# Patient Record
Sex: Female | Born: 1965 | Race: White | Hispanic: No | Marital: Married | Smoking: Never smoker
Health system: Southern US, Community
[De-identification: ages and names within clinical notes are randomized; demographics above are authoritative.]

## PROBLEM LIST (undated history)

## (undated) DIAGNOSIS — N83209 Unspecified ovarian cyst, unspecified side: Secondary | ICD-10-CM

## (undated) DIAGNOSIS — Z808 Family history of malignant neoplasm of other organs or systems: Secondary | ICD-10-CM

## (undated) DIAGNOSIS — Z801 Family history of malignant neoplasm of trachea, bronchus and lung: Secondary | ICD-10-CM

## (undated) DIAGNOSIS — D649 Anemia, unspecified: Secondary | ICD-10-CM

## (undated) DIAGNOSIS — Z800 Family history of malignant neoplasm of digestive organs: Secondary | ICD-10-CM

## (undated) DIAGNOSIS — Z8619 Personal history of other infectious and parasitic diseases: Secondary | ICD-10-CM

## (undated) DIAGNOSIS — D219 Benign neoplasm of connective and other soft tissue, unspecified: Secondary | ICD-10-CM

## (undated) DIAGNOSIS — Z8049 Family history of malignant neoplasm of other genital organs: Secondary | ICD-10-CM

## (undated) DIAGNOSIS — B009 Herpesviral infection, unspecified: Secondary | ICD-10-CM

## (undated) HISTORY — DX: Benign neoplasm of connective and other soft tissue, unspecified: D21.9

## (undated) HISTORY — DX: Family history of malignant neoplasm of digestive organs: Z80.0

## (undated) HISTORY — DX: Anemia, unspecified: D64.9

## (undated) HISTORY — DX: Personal history of other infectious and parasitic diseases: Z86.19

## (undated) HISTORY — DX: Unspecified ovarian cyst, unspecified side: N83.209

## (undated) HISTORY — DX: Family history of malignant neoplasm of other genital organs: Z80.49

## (undated) HISTORY — DX: Herpesviral infection, unspecified: B00.9

## (undated) HISTORY — DX: Family history of malignant neoplasm of trachea, bronchus and lung: Z80.1

## (undated) HISTORY — DX: Family history of malignant neoplasm of other organs or systems: Z80.8

---

## 2000-07-12 ENCOUNTER — Other Ambulatory Visit: Admission: RE | Admit: 2000-07-12 | Discharge: 2000-07-12 | Payer: Self-pay | Admitting: Family Medicine

## 2002-02-05 DIAGNOSIS — K219 Gastro-esophageal reflux disease without esophagitis: Secondary | ICD-10-CM | POA: Insufficient documentation

## 2004-02-18 ENCOUNTER — Ambulatory Visit: Payer: Self-pay | Admitting: Obstetrics & Gynecology

## 2004-04-06 ENCOUNTER — Observation Stay: Payer: Self-pay | Admitting: Unknown Physician Specialty

## 2004-04-14 ENCOUNTER — Observation Stay: Payer: Self-pay | Admitting: Unknown Physician Specialty

## 2004-04-26 ENCOUNTER — Inpatient Hospital Stay: Payer: Self-pay | Admitting: Obstetrics & Gynecology

## 2004-05-05 ENCOUNTER — Ambulatory Visit: Payer: Self-pay | Admitting: Family Medicine

## 2004-05-10 ENCOUNTER — Ambulatory Visit: Payer: Self-pay | Admitting: Cardiology

## 2004-05-22 ENCOUNTER — Ambulatory Visit: Payer: Self-pay | Admitting: Cardiology

## 2004-06-12 ENCOUNTER — Ambulatory Visit: Payer: Self-pay | Admitting: Cardiology

## 2005-02-05 HISTORY — PX: LEEP: SHX91

## 2005-05-06 ENCOUNTER — Emergency Department: Payer: Self-pay | Admitting: Emergency Medicine

## 2005-05-06 ENCOUNTER — Other Ambulatory Visit: Payer: Self-pay

## 2005-09-20 ENCOUNTER — Ambulatory Visit: Payer: Self-pay

## 2006-08-22 ENCOUNTER — Ambulatory Visit: Payer: Self-pay | Admitting: Family Medicine

## 2006-09-18 DIAGNOSIS — D239 Other benign neoplasm of skin, unspecified: Secondary | ICD-10-CM

## 2006-09-18 HISTORY — DX: Other benign neoplasm of skin, unspecified: D23.9

## 2006-10-23 ENCOUNTER — Emergency Department: Payer: Self-pay | Admitting: Emergency Medicine

## 2011-11-26 LAB — LIPID PANEL
Cholesterol: 152 mg/dL (ref 0–200)
HDL: 54 mg/dL (ref 35–70)
LDL CALC: 87 mg/dL
Triglycerides: 57 mg/dL (ref 40–160)

## 2011-11-26 LAB — BASIC METABOLIC PANEL
BUN: 9 mg/dL (ref 4–21)
Creatinine: 0.7 mg/dL (ref 0.5–1.1)
GLUCOSE: 96 mg/dL
Potassium: 4.3 mmol/L (ref 3.4–5.3)
Sodium: 138 mmol/L (ref 137–147)

## 2011-11-26 LAB — TSH: TSH: 2.24 u[IU]/mL (ref 0.41–5.90)

## 2011-11-26 LAB — HM PAP SMEAR: HM Pap smear: NEGATIVE

## 2011-11-26 LAB — CBC AND DIFFERENTIAL
HCT: 39 % (ref 36–46)
Hemoglobin: 13.1 g/dL (ref 12.0–16.0)
Platelets: 250 10*3/uL (ref 150–399)
WBC: 7.4 10*3/mL

## 2012-09-05 HISTORY — PX: OTHER SURGICAL HISTORY: SHX169

## 2012-09-30 ENCOUNTER — Ambulatory Visit: Payer: Self-pay | Admitting: Family Medicine

## 2012-12-11 LAB — HM PAP SMEAR: HM Pap smear: NORMAL

## 2012-12-16 ENCOUNTER — Ambulatory Visit: Payer: Self-pay | Admitting: Family Medicine

## 2013-02-05 DIAGNOSIS — D219 Benign neoplasm of connective and other soft tissue, unspecified: Secondary | ICD-10-CM

## 2013-02-05 HISTORY — DX: Benign neoplasm of connective and other soft tissue, unspecified: D21.9

## 2013-05-07 ENCOUNTER — Emergency Department: Payer: Self-pay | Admitting: Emergency Medicine

## 2013-11-06 ENCOUNTER — Emergency Department: Payer: Self-pay | Admitting: Emergency Medicine

## 2014-11-12 ENCOUNTER — Ambulatory Visit (INDEPENDENT_AMBULATORY_CARE_PROVIDER_SITE_OTHER): Payer: PRIVATE HEALTH INSURANCE | Admitting: Family Medicine

## 2014-11-12 ENCOUNTER — Encounter: Payer: Self-pay | Admitting: Family Medicine

## 2014-11-12 VITALS — BP 102/64 | HR 73 | Temp 98.4°F | Resp 16 | Ht 63.0 in | Wt 190.0 lb

## 2014-11-12 DIAGNOSIS — K2100 Gastro-esophageal reflux disease with esophagitis, without bleeding: Secondary | ICD-10-CM | POA: Insufficient documentation

## 2014-11-12 DIAGNOSIS — R002 Palpitations: Secondary | ICD-10-CM | POA: Insufficient documentation

## 2014-11-12 DIAGNOSIS — M62830 Muscle spasm of back: Secondary | ICD-10-CM | POA: Insufficient documentation

## 2014-11-12 DIAGNOSIS — E669 Obesity, unspecified: Secondary | ICD-10-CM | POA: Insufficient documentation

## 2014-11-12 DIAGNOSIS — R1310 Dysphagia, unspecified: Secondary | ICD-10-CM | POA: Insufficient documentation

## 2014-11-12 DIAGNOSIS — M7061 Trochanteric bursitis, right hip: Secondary | ICD-10-CM

## 2014-11-12 NOTE — Progress Notes (Signed)
       Patient: Ariel Lawson Female    DOB: Apr 22, 1965   49 y.o.   MRN: [id number] Visit Date: 11/12/2014  Today's Provider: Lelon Huh, MD   Chief Complaint  Patient presents with  . Hip Pain    x 1 year   Subjective:    Hip Pain  Incident onset: started 1 year ago. The injury mechanism is unknown. The pain is present in the right hip. The quality of the pain is described as aching. The pain is moderate (varies from moderate to severe). The pain has been intermittent since onset. Associated symptoms include numbness (occurs with prolonged sitting) and tingling (occurs with prolonged sitting). Pertinent negatives include no inability to bear weight or loss of motion. Exacerbated by: weather changes, laying on right side, crossing legs.  Patient states she has tried getting a new mattress to improve pain but it has not provided any relief. Patient states pain improves with walking exercises.      Allergies  Allergen Reactions  . Prevacid  [Lansoprazole] Itching   Previous Medications   IRON POLYSACCH CMPLX-B12-FA (FERREX 150 FORTE) 150-0.025-1 MG CAPS    Take 1 capsule by mouth 2 (two) times daily.   RABEPRAZOLE (ACIPHEX) 20 MG TABLET    Take 1 tablet by mouth daily.    Review of Systems  Constitutional: Negative for fever, chills, appetite change and fatigue.  Respiratory: Negative for chest tightness and shortness of breath.   Cardiovascular: Negative for chest pain, palpitations and leg swelling.  Gastrointestinal: Negative for nausea, vomiting and abdominal pain.  Musculoskeletal: Positive for arthralgias (right hip). Negative for myalgias, back pain, joint swelling, neck pain and neck stiffness.  Neurological: Positive for tingling (occurs with prolonged sitting) and numbness (occurs with prolonged sitting). Negative for dizziness, tremors, seizures, syncope, weakness, light-headedness and headaches.    Social History  Substance Use Topics  . Smoking status: Never  Smoker   . Smokeless tobacco: Not on file  . Alcohol Use: No   Objective:   BP 102/64 mmHg  Pulse 73  Temp(Src) 98.4 F (36.9 C) (Oral)  Resp 16  Ht 5\' 3"  (1.6 m)  Wt 190 lb (86.183 kg)  BMI 33.67 kg/m2  SpO2 100%  Physical Exam  Exam: Tender right greater trochanter. No tenderness of LS spine or SI joint.     Assessment & Plan:     1. Trochanteric bursitis of right hip Discussed treatment options including NSAIDs, application of ice, and steroid injections. She prefers conservative therapy at this time. She is to use soft cushion when lying on her side. OTC ibuprofen per label and frequent icing.        Lelon Huh, MD  [facility]

## 2014-11-12 NOTE — Patient Instructions (Signed)
Trochanteric Bursitis You have hip pain due to trochanteric bursitis. Bursitis means that the sack near the outside of the hip is filled with fluid and inflamed. This sack is made up of protective soft tissue. The pain from trochanteric bursitis can be severe and keep you from sleep. It can radiate to the buttocks or down the outside of the thigh to the knee. The pain is almost always worse when rising from the seated or lying position and with walking. Pain can improve after you take a few steps. It happens more often in people with hip joint and lumbar spine problems, such as arthritis or previous surgery. Very rarely the trochanteric bursa can become infected, and antibiotics and/or surgery may be needed. Treatment often includes an injection of local anesthetic mixed with cortisone medicine. This medicine is injected into the area where it is most tender over the hip. Repeat injections may be necessary if the response to treatment is slow. You can apply ice packs over the tender area for 30 minutes every 2 hours for the next few days. Anti-inflammatory and/or narcotic pain medicine may also be helpful. Limit your activity for the next few days if the pain continues. See your caregiver in 5-10 days if you are not greatly improved.  SEEK IMMEDIATE MEDICAL CARE IF:  You develop severe pain, fever, or increased redness.  You have pain that radiates below the knee. EXERCISES STRETCHING EXERCISES - Trochanteric Bursitis  These exercises may help you when beginning to rehabilitate your injury. Your symptoms may resolve with or without further involvement from your physician, physical therapist, or athletic trainer. While completing these exercises, remember:   Restoring tissue flexibility helps normal motion to return to the joints. This allows healthier, less painful movement and activity.  An effective stretch should be held for at least 30 seconds.  A stretch should never be painful. You should only  feel a gentle lengthening or release in the stretched tissue. STRETCH - Iliotibial Band  On the floor or bed, lie on your side so your injured leg is on top. Bend your knee and grab your ankle.  Slowly bring your knee back so that your thigh is in line with your trunk. Keep your heel at your buttocks and gently arch your back so your head, shoulders and hips line up.  Slowly lower your leg so that your knee approaches the floor/bed until you feel a gentle stretch on the outside of your thigh. If you do not feel a stretch and your knee will not fall farther, place the heel of your opposite foot on top of your knee and pull your thigh down farther.  Hold this stretch for __________ seconds.  Repeat __________ times. Complete this exercise __________ times per day. STRETCH - Hamstrings, Supine   Lie on your back. Loop a belt or towel over the ball of your foot as shown.  Straighten your knee and slowly pull on the belt to raise your injured leg. Do not allow the knee to bend. Keep your opposite leg flat on the floor.  Raise the leg until you feel a gentle stretch behind your knee or thigh. Hold this position for __________ seconds.  Repeat __________ times. Complete this stretch __________ times per day. STRETCH - Quadriceps, Prone   Lie on your stomach on a firm surface, such as a bed or padded floor.  Bend your knee and grasp your ankle. If you are unable to reach your ankle or pant leg, use a belt   around your foot to lengthen your reach.  Gently pull your heel toward your buttocks. Your knee should not slide out to the side. You should feel a stretch in the front of your thigh and/or knee.  Hold this position for __________ seconds.  Repeat __________ times. Complete this stretch __________ times per day. STRETCHING - Hip Flexors, Lunge Half kneel with your knee on the floor and your opposite knee bent and directly over your ankle.  Keep good posture with your head over your  shoulders. Tighten your buttocks to point your tailbone downward; this will prevent your back from arching too much.  You should feel a gentle stretch in the front of your thigh and/or hip. If you do not feel any resistance, slightly slide your opposite foot forward and then slowly lunge forward so your knee once again lines up over your ankle. Be sure your tailbone remains pointed downward.  Hold this stretch for __________ seconds.  Repeat __________ times. Complete this stretch __________ times per day. STRETCH - Adductors, Lunge  While standing, spread your legs.  Lean away from your injured leg by bending your opposite knee. You may rest your hands on your thigh for balance.  You should feel a stretch in your inner thigh. Hold for __________ seconds.  Repeat __________ times. Complete this exercise __________ times per day.   This information is not intended to replace advice given to you by your health care provider. Make sure you discuss any questions you have with your health care provider.   Document Released: 03/01/2004 Document Revised: 06/08/2014 Document Reviewed: 05/06/2008 Elsevier Interactive Patient Education 2016 Elsevier Inc.  

## 2014-11-23 ENCOUNTER — Encounter: Payer: Self-pay | Admitting: Family Medicine

## 2014-12-01 ENCOUNTER — Encounter: Payer: Self-pay | Admitting: Physician Assistant

## 2014-12-01 ENCOUNTER — Ambulatory Visit: Payer: Self-pay | Admitting: Physician Assistant

## 2014-12-01 VITALS — BP 119/70 | HR 87 | Temp 98.5°F

## 2014-12-01 DIAGNOSIS — M7071 Other bursitis of hip, right hip: Secondary | ICD-10-CM

## 2014-12-01 DIAGNOSIS — R002 Palpitations: Secondary | ICD-10-CM

## 2014-12-01 NOTE — Progress Notes (Signed)
S: c/o palpitations last night, got really bad while having an anxiety attack, no cp/ did have some sob not sure whether it was related to heart or her anxiety, no radiation or pain, no sweating, no pain or palpitations felt right now, just concerned, also having r hip pain for about a year, was told its bursitis but didn't want a steroid shot in her hip, no known injury, pain is worse when lying on r hip, after sitting for awhile, was better when was active in the gym, using advil for pain prn  O: vitals wnl, nad, appears well, lungs c t a, cv rrr, r hip tender at trochanteric bursa and IT band, fulll rom , pain reproduced with internal and external rotation, n/v intact , ekg nsr no abnormality noted  A: anxiety attack with palpitations, bursitis r hip with inflammation of IT band  P: labs drawn today, otc turmeric for hip pain, stretches, foam roller, ice, f/u with sports med

## 2014-12-04 LAB — COMPREHENSIVE METABOLIC PANEL
A/G RATIO: 1.6 (ref 1.1–2.5)
ALT: 11 IU/L (ref 0–32)
AST: 14 IU/L (ref 0–40)
Albumin: 4.2 g/dL (ref 3.5–5.5)
Alkaline Phosphatase: 77 IU/L (ref 39–117)
BILIRUBIN TOTAL: 0.2 mg/dL (ref 0.0–1.2)
BUN / CREAT RATIO: 17 (ref 9–23)
BUN: 12 mg/dL (ref 6–24)
CALCIUM: 9.3 mg/dL (ref 8.7–10.2)
CHLORIDE: 104 mmol/L (ref 97–106)
CO2: 22 mmol/L (ref 18–29)
Creatinine, Ser: 0.71 mg/dL (ref 0.57–1.00)
GFR, EST AFRICAN AMERICAN: 117 mL/min/{1.73_m2} (ref >59)
GFR, EST NON AFRICAN AMERICAN: 102 mL/min/{1.73_m2} (ref >59)
GLOBULIN, TOTAL: 2.6 g/dL (ref 1.5–4.5)
Glucose: 91 mg/dL (ref 65–99)
POTASSIUM: 4.2 mmol/L (ref 3.5–5.2)
SODIUM: 142 mmol/L (ref 136–144)
TOTAL PROTEIN: 6.8 g/dL (ref 6.0–8.5)

## 2014-12-07 LAB — IRON: Iron: 21 ug/dL — ABNORMAL LOW (ref 27–159)

## 2014-12-07 LAB — SPECIMEN STATUS REPORT

## 2014-12-07 NOTE — Progress Notes (Signed)
Spoke with patient about lab results and she expressed understanding.  Pt will continue to take her Iron supplements.

## 2014-12-07 NOTE — Progress Notes (Signed)
I called patient to inform her of her lab results.  Pt didn't answer so I left a message on her voicemail to return my call.

## 2014-12-14 ENCOUNTER — Ambulatory Visit: Payer: Self-pay | Admitting: Physician Assistant

## 2014-12-14 ENCOUNTER — Encounter: Payer: Self-pay | Admitting: Physician Assistant

## 2014-12-14 VITALS — BP 110/65 | HR 114 | Temp 98.6°F

## 2014-12-14 DIAGNOSIS — R197 Diarrhea, unspecified: Secondary | ICD-10-CM

## 2014-12-14 DIAGNOSIS — K648 Other hemorrhoids: Secondary | ICD-10-CM

## 2014-12-14 LAB — POCT URINALYSIS DIPSTICK
Bilirubin, UA: NEGATIVE
GLUCOSE UA: NEGATIVE
Ketones, UA: NEGATIVE
Leukocytes, UA: NEGATIVE
NITRITE UA: NEGATIVE
RBC UA: NEGATIVE
Spec Grav, UA: >=1.03
UROBILINOGEN UA: 0.2
pH, UA: 5.5

## 2014-12-14 MED ORDER — HYDROCORTISONE ACETATE 25 MG RE SUPP: 25.0000 mg | Freq: Two times a day (BID) | RECTAL | Status: DC

## 2014-12-14 NOTE — Patient Instructions (Signed)
Food Choices to Help Relieve Diarrhea, Adult When you have diarrhea, the foods you eat and your eating habits are very important. Choosing the right foods and drinks can help relieve diarrhea. Also, because diarrhea can last up to 7 days, you need to replace lost fluids and electrolytes (such as sodium, potassium, and chloride) in order to help prevent dehydration.  WHAT GENERAL GUIDELINES DO I NEED TO FOLLOW?  Slowly drink 1 cup (8 oz) of fluid for each episode of diarrhea. If you are getting enough fluid, your urine will be clear or pale yellow.  Eat starchy foods. Some good choices include white rice, white toast, pasta, low-fiber cereal, baked potatoes (without the skin), saltine crackers, and bagels.  Avoid large servings of any cooked vegetables.  Limit fruit to two servings per day. A serving is  cup or 1 small piece.  Choose foods with less than 2 g of fiber per serving.  Limit fats to less than 8 tsp (38 g) per day.  Avoid fried foods.  Eat foods that have probiotics in them. Probiotics can be found in certain dairy products.  Avoid foods and beverages that may increase the speed at which food moves through the stomach and intestines (gastrointestinal tract). Things to avoid include:  High-fiber foods, such as dried fruit, raw fruits and vegetables, nuts, seeds, and whole grain foods.  Spicy foods and high-fat foods.  Foods and beverages sweetened with high-fructose corn syrup, honey, or sugar alcohols such as xylitol, sorbitol, and mannitol. WHAT FOODS ARE RECOMMENDED? Grains White rice. White, French, or pita breads (fresh or toasted), including plain rolls, buns, or bagels. White pasta. Saltine, soda, or graham crackers. Pretzels. Low-fiber cereal. Cooked cereals made with water (such as cornmeal, farina, or cream cereals). Plain muffins. Matzo. Melba toast. Zwieback.  Vegetables Potatoes (without the skin). Strained tomato and vegetable juices. Most well-cooked and canned  vegetables without seeds. Tender lettuce. Fruits Cooked or canned applesauce, apricots, cherries, fruit cocktail, grapefruit, peaches, pears, or plums. Fresh bananas, apples without skin, cherries, grapes, cantaloupe, grapefruit, peaches, oranges, or plums.  Meat and Other Protein Products Baked or boiled chicken. Eggs. Tofu. Fish. Seafood. Smooth peanut butter. Ground or well-cooked tender beef, ham, veal, lamb, pork, or poultry.  Dairy Plain yogurt, kefir, and unsweetened liquid yogurt. Lactose-free milk, buttermilk, or soy milk. Plain hard cheese. Beverages Sport drinks. Clear broths. Diluted fruit juices (except prune). Regular, caffeine-free sodas such as ginger ale. Water. Decaffeinated teas. Oral rehydration solutions. Sugar-free beverages not sweetened with sugar alcohols. Other Bouillon, broth, or soups made from recommended foods.  The items listed above may not be a complete list of recommended foods or beverages. Contact your dietitian for more options. WHAT FOODS ARE NOT RECOMMENDED? Grains Whole grain, whole wheat, bran, or rye breads, rolls, pastas, crackers, and cereals. Wild or brown rice. Cereals that contain more than 2 g of fiber per serving. Corn tortillas or taco shells. Cooked or dry oatmeal. Granola. Popcorn. Vegetables Raw vegetables. Cabbage, broccoli, Brussels sprouts, artichokes, baked beans, beet greens, corn, kale, legumes, peas, sweet potatoes, and yams. Potato skins. Cooked spinach and cabbage. Fruits Dried fruit, including raisins and dates. Raw fruits. Stewed or dried prunes. Fresh apples with skin, apricots, mangoes, pears, raspberries, and strawberries.  Meat and Other Protein Products Chunky peanut butter. Nuts and seeds. Beans and lentils. Bacon.  Dairy High-fat cheeses. Milk, chocolate milk, and beverages made with milk, such as milk shakes. Cream. Ice cream. Sweets and Desserts Sweet rolls, doughnuts, and sweet breads.   Pancakes and waffles. Fats and  Oils Butter. Cream sauces. Margarine. Salad oils. Plain salad dressings. Olives. Avocados.  Beverages Caffeinated beverages (such as coffee, tea, soda, or energy drinks). Alcoholic beverages. Fruit juices with pulp. Prune juice. Soft drinks sweetened with high-fructose corn syrup or sugar alcohols. Other Coconut. Hot sauce. Chili powder. Mayonnaise. Gravy. Cream-based or milk-based soups.  The items listed above may not be a complete list of foods and beverages to avoid. Contact your dietitian for more information. WHAT SHOULD I DO IF I BECOME DEHYDRATED? Diarrhea can sometimes lead to dehydration. Signs of dehydration include dark urine and dry mouth and skin. If you think you are dehydrated, you should rehydrate with an oral rehydration solution. These solutions can be purchased at pharmacies, retail stores, or online.  Drink -1 cup (120-240 mL) of oral rehydration solution each time you have an episode of diarrhea. If drinking this amount makes your diarrhea worse, try drinking smaller amounts more often. For example, drink 1-3 tsp (5-15 mL) every 5-10 minutes.  A general rule for staying hydrated is to drink 1-2 L of fluid per day. Talk to your health care provider about the specific amount you should be drinking each day. Drink enough fluids to keep your urine clear or pale yellow.   This information is not intended to replace advice given to you by your health care provider. Make sure you discuss any questions you have with your health care provider.   Document Released: 04/14/2003 Document Revised: 02/12/2014 Document Reviewed: 12/15/2012 Elsevier Interactive Patient Education 2016 Elsevier Inc. Lactose Intolerance, Adult Lactose is the natural sugar found in milk and milk products, such as cheese and yogurt. Lactose is digested by lactase, an enzyme in your small intestine. Some people do not produce enough lactase to digest lactose. This is called lactose intolerance. Lactose intolerance  is different from milk allergy, which is a more serious reaction to the protein in milk.  CAUSES Causes of lactose intolerance may include:   Normal aging. The ability to produce lactase may decline with age, causing lactose intolerance over time.  Being born without the ability to make lactase.   Digestive diseases such as gastroenteritis or inflammatory bowel disease.  Surgery or injuries to your small intestine.  Infection in your intestines.  Certain antibiotic medicines and cancer treatments. SIGNS AND SYMPTOMS  Lactose intolerance can cause uncomfortable symptoms. These are likely to occur within 30 minutes to 2 hours after eating or drinking foods containing lactose. Symptoms of lactose intolerance may include:  Nausea.  Diarrhea.  Abdominal cramps or pain.  Bloating.   Gas.  DIAGNOSIS  There are several tests your health care provider can do to diagnose lactose intolerance. These tests include a hydrogen breath test and stool acidity test.  TREATMENT  No treatment can improve your body's ability to produce lactase. However, your symptoms can be controlled by limiting or avoiding milk products and other sources of lactose and adjusting your diet. Lactose-free milk is often tolerated. Lactose digestion may also be improved by adding lactase drops to regular milk or by taking lactase tablets when dairy products are consumed. Tolerance to lactose is individual. Some people may be able to eat or drink small amounts of products with lactose, while other may need to avoid lactose entirely. Talk to your health care provider about what is best for you.  HOME CARE INSTRUCTIONS  Limit or avoidfoods, beverages, and medicines containing lactose as directed by your health care provider.   Read food and  medicine labels carefully to avoid products containing lactose, milk solids, casein, or whey.  If you eliminate dairy products, replace the protein, calcium, vitamin D, and other  nutrients they contain through other foods. A registered dietitian or your health care provider can help you adjust your diet.  Choose a milk substitute that is fortified with calcium and vitamin D. Be aware that soy milk contains high quality protein, while milks made from nuts or grains contain very little protein.  Use lactase drops or tablets if directed by your health care provider. SEEK MEDICAL CARE IF: You have no relief from your symptoms after eliminating milk products and other sources of lactose.    This information is not intended to replace advice given to you by your health care provider. Make sure you discuss any questions you have with your health care provider.   Document Released: 01/22/2005 Document Revised: 02/12/2014 Document Reviewed: 04/24/2013 Elsevier Interactive Patient Education 2016 [employer]. Hemorrhoids Hemorrhoids are puffy (swollen) veins around the rectum or anus. Hemorrhoids can cause pain, itching, bleeding, or irritation. HOME CARE  Eat foods with fiber, such as whole grains, beans, nuts, fruits, and vegetables. Ask your doctor about taking products with added fiber in them (fibersupplements).  Drink enough fluid to keep your pee (urine) clear or pale yellow.  Exercise often.  Go to the bathroom when you have the urge to poop. Do not wait.  Avoid straining to poop (bowel movement).  Keep the butt area dry and clean. Use wet toilet paper or moist paper towels.  Medicated creams and medicine inserted into the anus (anal suppository) may be used or applied as told.  Only take medicine as told by your doctor.  Take a warm water bath (sitz bath) for 15-20 minutes to ease pain. Do this 3-4 times a day.  Place ice packs on the area if it is tender or puffy. Use the ice packs between the warm water baths.  Put ice in a plastic bag.  Place a towel between your skin and the bag.  Leave the ice on for 15-20 minutes, 03-04 times a day.  Do not  use a donut-shaped pillow or sit on the toilet for a long time. GET HELP RIGHT AWAY IF:   You have more pain that is not controlled by treatment or medicine.  You have bleeding that will not stop.  You have trouble or are unable to poop (bowel movement).  You have pain or puffiness outside the area of the hemorrhoids. MAKE SURE YOU:   Understand these instructions.  Will watch your condition.  Will get help right away if you are not doing well or get worse.   This information is not intended to replace advice given to you by your health care provider. Make sure you discuss any questions you have with your health care provider.   Document Released: 11/01/2007 Document Revised: 01/09/2012 Document Reviewed: 12/04/2011 Elsevier Interactive Patient Education [employer].

## 2014-12-14 NOTE — Progress Notes (Signed)
S: had diarrhea last week , stopped on Sunday, was worse after drinking milk products, did have some chills, no known fever, no uti sx, some abd pain and reflux with nausea also, is better but is eating only broth, ?what to do, also hemorrhoids are flared from the diarrhea  O: vitals wnl, nad, ua wnl, lungs c t a, cv rrr, abd soft mildly tender in lq b/l, bs normal all 4 quads  A: diarrhea, hemorrhoids  P: brat diet, anusol hc suppositories

## 2015-02-23 ENCOUNTER — Other Ambulatory Visit (INDEPENDENT_AMBULATORY_CARE_PROVIDER_SITE_OTHER): Payer: Managed Care, Other (non HMO)

## 2015-02-23 ENCOUNTER — Encounter: Payer: Self-pay | Admitting: Physician Assistant

## 2015-02-23 ENCOUNTER — Ambulatory Visit: Payer: Self-pay | Admitting: Physician Assistant

## 2015-02-23 ENCOUNTER — Ambulatory Visit (INDEPENDENT_AMBULATORY_CARE_PROVIDER_SITE_OTHER): Payer: Managed Care, Other (non HMO) | Admitting: Family Medicine

## 2015-02-23 ENCOUNTER — Encounter: Payer: Self-pay | Admitting: Family Medicine

## 2015-02-23 VITALS — BP 110/70 | HR 83 | Temp 98.5°F

## 2015-02-23 VITALS — BP 108/74 | HR 66 | Wt 189.0 lb

## 2015-02-23 DIAGNOSIS — M25552 Pain in left hip: Secondary | ICD-10-CM | POA: Diagnosis not present

## 2015-02-23 DIAGNOSIS — Z299 Encounter for prophylactic measures, unspecified: Secondary | ICD-10-CM

## 2015-02-23 DIAGNOSIS — D509 Iron deficiency anemia, unspecified: Secondary | ICD-10-CM

## 2015-02-23 DIAGNOSIS — M25551 Pain in right hip: Secondary | ICD-10-CM | POA: Diagnosis not present

## 2015-02-23 DIAGNOSIS — M7061 Trochanteric bursitis, right hip: Secondary | ICD-10-CM

## 2015-02-23 DIAGNOSIS — Z713 Dietary counseling and surveillance: Secondary | ICD-10-CM

## 2015-02-23 MED ORDER — DICLOFENAC SODIUM 2 % TD SOLN
TRANSDERMAL | Status: DC
Start: 2015-02-23 — End: 2015-07-12

## 2015-02-23 NOTE — Progress Notes (Signed)
[facility] [address], [place] [address] Phone: [phone] Subjective:    I'm seeing this patient by the request  of:  Lelon Huh, MD   CC: Right hip pain  [id number] Ariel Lawson is a 50 y.o. female coming in with complaint of right hip pain. Patient states that she has had this pain for proximal 3 years. Patient describes the pain as more of a dull, throbbing aching sensation. Patient states it's on the lateral aspect of the hip. In any true pain. Seems to be intermittent coming go. Can be worse after sitting and try to get up. Patient states a lot of walking and sitting seems to give her discomfort. Mild radiation down the leg but never past the knee. No significant back pain associated with it. No weakness. States that it used to wake her up at night but that part seems to be getting better since she's gotten a new mattress. Rates the severity pain 8 out of 10 because it does keep her from increasing her working out which keeps her from losing weight.     Past Medical History  Diagnosis Date  . History of chicken pox   . Herpes simplex    Past Surgical History  Procedure Laterality Date  . Leep  2007    Leep cone Biopsy of Cervix  . Screning mammogram  09/2012    ordered through [place] employer per pt report   Social History   Social History  . Marital Status: Married    Spouse Name: N/A  . Number of Children: 4  . Years of Education: N/A   Social History Main Topics  . Smoking status: Never Smoker   . Smokeless tobacco: Not on file  . Alcohol Use: No  . Drug Use: No  . Sexual Activity: Not on file   Other Topics Concern  . Not on file   Social History Narrative   Allergies  Allergen Reactions  . Prevacid  [Lansoprazole] Itching   Family History  Problem Relation Age of Onset  . Hypertension Mother   . Diabetes Mother     type 2  . Hyperlipidemia Mother   . Lung cancer Father   . Melanoma Sister     in  remission x 8 years. Diagnosed in her 81's  . Thyroid cancer Brother     Diagnosed in her 52's. In remission x 5 years    Past medical history, social, surgical and family history all reviewed in electronic medical record.  No pertanent information unless stated regarding to the chief complaint.   Review of Systems: No headache, visual changes, nausea, vomiting, diarrhea, constipation, dizziness, abdominal pain, skin rash, fevers, chills, night sweats, weight loss, swollen lymph nodes, body aches, joint swelling, muscle aches, chest pain, shortness of breath, mood changes.   Objective Blood pressure 108/74, pulse 66, weight 189 lb (85.73 kg), SpO2 98 %.  General: No apparent distress alert and oriented x3 mood and affect normal, dressed appropriately.  HEENT: Pupils equal, extraocular movements intact  Respiratory: Patient's speak in full sentences and does not appear short of breath  Cardiovascular: No lower extremity edema, non tender, no erythema  Skin: Warm dry intact with no signs of infection or rash on extremities or on axial skeleton.  Abdomen: Soft nontender  Neuro: Cranial nerves II through XII are intact, neurovascularly intact in all extremities with 2+ DTRs and 2+ pulses.  Lymph: No lymphadenopathy of posterior or anterior cervical chain or  axillae bilaterally.  Gait normal with good balance and coordination.  MSK:  Non tender with full range of motion and good stability and symmetric strength and tone of shoulders, elbows, wrist, hip, knee and ankles bilaterally.   [id number] ROM IR: 15 Deg, ER: 45 Deg, Flexion: 120 Deg, Extension: 100 Deg, Abduction: 45 Deg, Adduction: 25 Deg Strength IR: 5/5, ER: 5/5, Flexion: 5/5, Extension: 5/5, Abduction: 3/5, Adduction: 5/5 Pelvic alignment unremarkable to inspection and palpation. Standing hip rotation and gait without trendelenburg sign / unsteadiness. Greater trochanter severe tenderness to palpation No tenderness over piriformis    Positive Faber no pain with internal rotation Negative straight leg test No SI joint tenderness and normal minimal SI movement. Contralateral hip unremarkable   Procedure note 97110; 15 minutes spent for Therapeutic exercises as stated in above notes.  This included exercises focusing on stretching, strengthening, with significant focus on eccentric aspects.  Hip strengthening exercises which included:  Pelvic tilt/bracing to help with proper recruitment of the lower abs and pelvic floor muscles  Glute strengthening to properly contract glutes without over-engaging low back and hamstrings - prone hip extension and glute bridge exercises Proper stretching techniques to increase effectiveness for the hip flexors, groin, quads, piriformis and low back when appropriate  Proper technique shown and discussed handout in great detail with ATC.  All questions were discussed and answered.     Impression and Recommendations:     This case required medical decision making of moderate complexity.      Note: This dictation was prepared with Dragon dictation along with smaller phrase technology. Any transcriptional errors that result from this process are unintentional.

## 2015-02-23 NOTE — Patient Instructions (Signed)
Good to meet you both Ice 20 minutes 2 times daily. Usually after activity and before bed. Exercises 3 times a week.  pennsaid pinkie amount topically 2 times daily as needed.  Vitamin d 2000 IU daily  Turmeric 500mg  twice daily You have greater trochanteric bursitis.  Elliptical and bike is good.  Not treadmill or incline Machines instead of free weights See me again in 3 weeks and if not better we may want to consider an injection.

## 2015-02-23 NOTE — Progress Notes (Signed)
S: pt here for yearly fasting labs, states is starting a weight loss program and was told to do her labs prior, no complaints,   O: vitals wnl, nad, good mood and affect, neuro intact, labs drawn in clinic by rma  A: weight loss consult, labs  P: exec panel, ferritin , vit d; magnesium; pt requested a cortisol leptin lab bc the other person doing the diet said she needed that, told her not appropriate as we use cortisol levels to dx cushings, addison's etc; specific test she is asking for is used for research purposes only and I do not think this appropriate for the employee clinic

## 2015-02-23 NOTE — Assessment & Plan Note (Signed)
Patient does have more of a bursitis. Differential also includes more of a tendinitis of the gluteal tendon of the piriformis distally. I do not feel that this has any lumbar radiculopathy. We discussed different treatment options and patient has elected try conservative therapy. Patient is going to try topical anti-inflammatories and prescription was given, home exercises and patient work with athletic trainer to learn them in greater detail. We discussed proper shoe choices, we discussed position in bed, we discussed ergonomics are up-to-date. Patient and will come back and see me again in 3 weeks. If continued have pain possible ultrasound guided injection could be beneficial.

## 2015-02-24 LAB — CMP12+LP+TP+TSH+6AC+CBC/D/PLT
A/G RATIO: 1.6 (ref 1.1–2.5)
ALBUMIN: 4 g/dL (ref 3.5–5.5)
ALT: 8 IU/L (ref 0–32)
AST: 10 IU/L (ref 0–40)
Alkaline Phosphatase: 68 IU/L (ref 39–117)
BASOS ABS: 0 10*3/uL (ref 0.0–0.2)
BUN/Creatinine Ratio: 18 (ref 9–23)
BUN: 14 mg/dL (ref 6–24)
Basos: 1 %
CALCIUM: 8.7 mg/dL (ref 8.7–10.2)
CHLORIDE: 102 mmol/L (ref 96–106)
CHOLESTEROL TOTAL: 167 mg/dL (ref 100–199)
CREATININE: 0.79 mg/dL (ref 0.57–1.00)
Chol/HDL Ratio: 3.3 ratio units (ref 0.0–4.4)
EOS (ABSOLUTE): 0.1 10*3/uL (ref 0.0–0.4)
Eos: 2 %
FREE THYROXINE INDEX: 2.3 (ref 1.2–4.9)
GFR calc Af Amer: 102 mL/min/{1.73_m2} (ref >59)
GFR, EST NON AFRICAN AMERICAN: 89 mL/min/{1.73_m2} (ref >59)
GGT: 12 IU/L (ref 0–60)
GLOBULIN, TOTAL: 2.5 g/dL (ref 1.5–4.5)
Glucose: 103 mg/dL — ABNORMAL HIGH (ref 65–99)
HDL: 50 mg/dL (ref >39)
Hematocrit: 33.2 % — ABNORMAL LOW (ref 34.0–46.6)
Hemoglobin: 10.7 g/dL — ABNORMAL LOW (ref 11.1–15.9)
IMMATURE GRANULOCYTES: 0 %
IRON: 20 ug/dL — AB (ref 27–159)
Immature Grans (Abs): 0 10*3/uL (ref 0.0–0.1)
LDH: 137 IU/L (ref 119–226)
LDL Calculated: 109 mg/dL — ABNORMAL HIGH (ref 0–99)
LYMPHS ABS: 2 10*3/uL (ref 0.7–3.1)
Lymphs: 32 %
MCH: 26.2 pg — AB (ref 26.6–33.0)
MCHC: 32.2 g/dL (ref 31.5–35.7)
MCV: 81 fL (ref 79–97)
Monocytes Absolute: 0.3 10*3/uL (ref 0.1–0.9)
Monocytes: 5 %
NEUTROS PCT: 60 %
Neutrophils Absolute: 3.8 10*3/uL (ref 1.4–7.0)
PHOSPHORUS: 2.9 mg/dL (ref 2.5–4.5)
PLATELETS: 312 10*3/uL (ref 150–379)
POTASSIUM: 4.2 mmol/L (ref 3.5–5.2)
RBC: 4.09 x10E6/uL (ref 3.77–5.28)
RDW: 16.5 % — AB (ref 12.3–15.4)
SODIUM: 139 mmol/L (ref 134–144)
T3 UPTAKE RATIO: 25 % (ref 24–39)
T4 TOTAL: 9 ug/dL (ref 4.5–12.0)
TOTAL PROTEIN: 6.5 g/dL (ref 6.0–8.5)
TSH: 2.31 u[IU]/mL (ref 0.450–4.500)
Triglycerides: 40 mg/dL (ref 0–149)
URIC ACID: 3.3 mg/dL (ref 2.5–7.1)
VLDL CHOLESTEROL CAL: 8 mg/dL (ref 5–40)
WBC: 6.4 10*3/uL (ref 3.4–10.8)

## 2015-02-24 LAB — FERRITIN: Ferritin: 5 ng/mL — ABNORMAL LOW (ref 15–150)

## 2015-02-24 LAB — MAGNESIUM: MAGNESIUM: 2.1 mg/dL (ref 1.6–2.3)

## 2015-02-24 LAB — VITAMIN D 25 HYDROXY (VIT D DEFICIENCY, FRACTURES): Vit D, 25-Hydroxy: 26 ng/mL — ABNORMAL LOW (ref 30.0–100.0)

## 2015-02-24 NOTE — Progress Notes (Signed)
Patient ID: Ariel Lawson, female   DOB: [birth year], 50 y.o.   MRN: [id number] Per Susan's authorization I scheduled patient to see the Hematologist at the [facility] for 03/02/15 at 10:30am. Patient has been notified.

## 2015-02-24 NOTE — Addendum Note (Signed)
Addended by: Rudene Anda T on: 02/24/2015 11:04 AM   Modules accepted: Orders

## 2015-03-02 ENCOUNTER — Inpatient Hospital Stay: Payer: Managed Care, Other (non HMO) | Attending: Internal Medicine | Admitting: Internal Medicine

## 2015-03-02 ENCOUNTER — Encounter: Payer: Self-pay | Admitting: Internal Medicine

## 2015-03-02 VITALS — BP 104/64 | HR 78 | Temp 98.4°F | Resp 18 | Ht 63.0 in | Wt 188.5 lb

## 2015-03-02 DIAGNOSIS — N920 Excessive and frequent menstruation with regular cycle: Secondary | ICD-10-CM | POA: Diagnosis not present

## 2015-03-02 DIAGNOSIS — D509 Iron deficiency anemia, unspecified: Secondary | ICD-10-CM

## 2015-03-02 DIAGNOSIS — Z79899 Other long term (current) drug therapy: Secondary | ICD-10-CM | POA: Diagnosis not present

## 2015-03-02 NOTE — Progress Notes (Signed)
Pt states that she has been anemic since she was having children.  Through the years she just did not take iron pills and her energy level is down.  She would like to start new diet and also execise.  The PA an employee clinic started her on ferrex bid and she just started taking it on regualr basis last week.  She just does not like taking pills.  She has heavy menstrual cycles lasting 3 days that makes her so tired she ends up in the bed after work. She has noticed that she is feeling better since being on iron pills.  She tolerates iron pills without any signs and sx of discomfort or constipation.

## 2015-03-02 NOTE — Progress Notes (Signed)
Ariel Lawson @ [facility] Telephone:([phone]) [phone]  Fax:([phone]) [phone]     Ariel Lawson OB: Jun 11, 1965  MR#: [id number]  [id number]  Patient Care Team: Birdie Sons, MD as PCP - General (Family Medicine) Brendolyn Patty, MD (Dermatology) Tiajuana Amass, MD as Referring Physician (Allergy and Immunology)  CHIEF COMPLAINT:  Chief Complaint  Patient presents with  . IDA     No history exists.    No flowsheet data found.  HISTORY OF PRESENT ILLNESS:   [name] is a 50 year old Caucasian female, who is referred for evaluation of iron deficiency anemia. She knows about heavy menstrual bleeding for many years, which last for 3 days, but with multiple clots during those days. She has not been taking iron supplementation consistently, but claims that she was diagnosed with iron deficiency during the pregnancy and for short period of time she would take prenatal vitamins. She denies any bleeding from any other source. She is up-to-date with mammogram. She denies nausea, vomiting, diarrhea, constipation, dizziness, chest pain, palpitations.  REVIEW OF SYSTEMS:   Review of Systems  All other systems reviewed and are negative.    PAST MEDICAL HISTORY: Past Medical History  Diagnosis Date  . History of chicken pox   . Herpes simplex     PAST SURGICAL HISTORY: Past Surgical History  Procedure Laterality Date  . Leep  2007    Leep cone Biopsy of Cervix  . Screning mammogram  09/2012    ordered through [place] employer per pt report    FAMILY HISTORY Family History  Problem Relation Age of Onset  . Hypertension Mother   . Diabetes Mother     type 2  . Hyperlipidemia Mother   . Lung cancer Father   . Melanoma Sister     in remission x 8 years. Diagnosed in her 85's  . Thyroid cancer Brother     Diagnosed in her 44's. In remission x 5 years    ADVANCED DIRECTIVES:  No flowsheet data found.  HEALTH MAINTENANCE: Social History  Substance Use Topics  . Smoking status:  Never Smoker   . Smokeless tobacco: Not on file  . Alcohol Use: No     Allergies  Allergen Reactions  . Prevacid  [Lansoprazole] Itching    Current Outpatient Prescriptions  Medication Sig Dispense Refill  . Diclofenac Sodium 2 % SOLN Apply to affected area twice daily 1 Bottle 2  . Iron Polysacch Cmplx-B12-FA (FERREX 150 FORTE) 150-0.025-1 MG CAPS Take 1 capsule by mouth 2 (two) times daily.    . RABEprazole (ACIPHEX) 20 MG tablet Take 1 tablet by mouth daily.    . Turmeric 500 MG CAPS Take 1 Dose by mouth 2 (two) times daily.     No current facility-administered medications for this visit.    OBJECTIVE:  Filed Vitals:   03/02/15 1044  BP: 104/64  Pulse: 78  Temp: 98.4 F (36.9 C)  Resp: 18     Body mass index is 33.4 kg/(m^2).    ECOG FS:0 - Asymptomatic  Physical Exam  Constitutional: She is oriented to person, place, and time and well-developed, well-nourished, and in no distress. No distress.  Age appropriate obese Caucasian female  HENT:  Head: Normocephalic and atraumatic.  Right Ear: External ear normal.  Left Ear: External ear normal.  Mouth/Throat: Oropharynx is clear and moist.  Eyes: Conjunctivae are normal. Pupils are equal, round, and reactive to light. Right eye exhibits no discharge. Left eye exhibits no discharge. No scleral icterus.  Neck: Normal  range of motion. Neck supple. No JVD present. No tracheal deviation present. No thyromegaly present.  Cardiovascular: Normal rate, regular rhythm, normal heart sounds and intact distal pulses.  Exam reveals no gallop and no friction rub.   No murmur heard. Pulmonary/Chest: Effort normal and breath sounds normal. No stridor. No respiratory distress. She has no wheezes. She has no rales. She exhibits no tenderness.  Abdominal: Soft. Bowel sounds are normal. She exhibits no distension and no mass. There is no tenderness. There is no rebound and no guarding.  Genitourinary:  Postponed  Musculoskeletal: Normal  range of motion. She exhibits no edema or tenderness.  Lymphadenopathy:    She has no cervical adenopathy.  Neurological: She is alert and oriented to person, place, and time. She has normal reflexes. No cranial nerve deficit. She exhibits normal muscle tone. Gait normal. Coordination normal. GCS score is 15.  Skin: Skin is warm. No rash noted. She is not diaphoretic. No erythema. No pallor.  Psychiatric: Mood, memory, affect and judgment normal.  Nursing note and vitals reviewed.    LAB RESULTS:  CBC Latest Ref Rng 02/23/2015 11/26/2011  WBC 3.4 - 10.8 x10E3/uL 6.4 7.4  Hemoglobin 12.0 - 16.0 g/dL - 13.1  Hematocrit 34.0 - 46.6 % 33.2(L) 39  Platelets 150 - 379 x10E3/uL 312 250    No visits with results within 5 Day(s) from this visit. Latest known visit with results is:  Office Visit on 02/23/2015  Component Date Value Ref Range Status  . Glucose 02/23/2015 103* 65 - 99 mg/dL Final  . Uric Acid 02/23/2015 3.3  2.5 - 7.1 mg/dL Final              Therapeutic target for gout patients: <6.0  . BUN 02/23/2015 14  6 - 24 mg/dL Final  . Creatinine, Ser 02/23/2015 0.79  0.57 - 1.00 mg/dL Final  . GFR calc non Af Amer 02/23/2015 89  >59 mL/min/1.73 Final  . GFR calc Af Amer 02/23/2015 102  >59 mL/min/1.73 Final  . BUN/Creatinine Ratio 02/23/2015 18  9 - 23 Final  . Sodium 02/23/2015 139  134 - 144 mmol/L Final  . Potassium 02/23/2015 4.2  3.5 - 5.2 mmol/L Final  . Chloride 02/23/2015 102  96 - 106 mmol/L Final  . Calcium 02/23/2015 8.7  8.7 - 10.2 mg/dL Final  . Phosphorus 02/23/2015 2.9  2.5 - 4.5 mg/dL Final  . Total Protein 02/23/2015 6.5  6.0 - 8.5 g/dL Final  . Albumin 02/23/2015 4.0  3.5 - 5.5 g/dL Final  . Globulin, Total 02/23/2015 2.5  1.5 - 4.5 g/dL Final  . Albumin/Globulin Ratio 02/23/2015 1.6  1.1 - 2.5 Final  . Bilirubin Total 02/23/2015 <0.2  0.0 - 1.2 mg/dL Final  . Alkaline Phosphatase 02/23/2015 68  39 - 117 IU/L Final  . LDH 02/23/2015 137  119 - 226 IU/L Final    . AST 02/23/2015 10  0 - 40 IU/L Final  . ALT 02/23/2015 8  0 - 32 IU/L Final  . GGT 02/23/2015 12  0 - 60 IU/L Final  . Iron 02/23/2015 20* 27 - 159 ug/dL Final  . Cholesterol, Total 02/23/2015 167  100 - 199 mg/dL Final  . Triglycerides 02/23/2015 40  0 - 149 mg/dL Final  . HDL 02/23/2015 50  >39 mg/dL Final  . VLDL Cholesterol Cal 02/23/2015 8  5 - 40 mg/dL Final  . LDL Calculated 02/23/2015 109* 0 - 99 mg/dL Final  . Chol/HDL Ratio 02/23/2015 3.3  0.0 - 4.4 ratio units Final   Comment:                                   T. Chol/HDL Ratio                                             Men  Women                               1/2 Avg.Risk  3.4    3.3                                   Avg.Risk  5.0    4.4                                2X Avg.Risk  9.6    7.1                                3X Avg.Risk 23.4   11.0   . Estimated CHD Risk 02/23/2015  < 0.5  0.0 - 1.0  times avg. Final   Comment:                                   T. Chol/HDL Ratio                                             Men  Women                               1/2 Avg.Risk  3.4    3.3                                   Avg.Risk  5.0    4.4                                2X Avg.Risk  9.6    7.1                                3X Avg.Risk 23.4   11.0 The CHD Risk is based on the T. Chol/HDL ratio.  Other factors affect CHD Risk such as hypertension, smoking, diabetes, severe obesity, and family history of pre- mature CHD.   . TSH 02/23/2015 2.310  0.450 - 4.500 uIU/mL Final  . T4, Total 02/23/2015 9.0  4.5 - 12.0 ug/dL Final  . T3 Uptake Ratio 02/23/2015 25  24 - 39 % Final  . Free Thyroxine Index 02/23/2015 2.3  1.2 - 4.9 Final  . WBC 02/23/2015 6.4  3.4 - 10.8 x10E3/uL Final  .  RBC 02/23/2015 4.09  3.77 - 5.28 x10E6/uL Final  . Hemoglobin 02/23/2015 10.7* 11.1 - 15.9 g/dL Final  . Hematocrit 02/23/2015 33.2* 34.0 - 46.6 % Final  . MCV 02/23/2015 81  79 - 97 fL Final  . MCH 02/23/2015 26.2* 26.6 - 33.0 pg Final  .  MCHC 02/23/2015 32.2  31.5 - 35.7 g/dL Final  . RDW 02/23/2015 16.5* 12.3 - 15.4 % Final  . Platelets 02/23/2015 312  150 - 379 x10E3/uL Final  . Neutrophils 02/23/2015 60   Final  . Lymphs 02/23/2015 32   Final  . Monocytes 02/23/2015 5   Final  . Eos 02/23/2015 2   Final  . Basos 02/23/2015 1   Final  . Neutrophils Absolute 02/23/2015 3.8  1.4 - 7.0 x10E3/uL Final  . Lymphocytes Absolute 02/23/2015 2.0  0.7 - 3.1 x10E3/uL Final  . Monocytes Absolute 02/23/2015 0.3  0.1 - 0.9 x10E3/uL Final  . EOS (ABSOLUTE) 02/23/2015 0.1  0.0 - 0.4 x10E3/uL Final  . Basophils Absolute 02/23/2015 0.0  0.0 - 0.2 x10E3/uL Final  . Immature Granulocytes 02/23/2015 0   Final  . Immature Grans (Abs) 02/23/2015 0.0  0.0 - 0.1 x10E3/uL Final  . Magnesium 02/23/2015 2.1  1.6 - 2.3 mg/dL Final  . Vit D, 25-Hydroxy 02/23/2015 26.0* 30.0 - 100.0 ng/mL Final   Comment: Vitamin D deficiency has been defined by the [place] practice guideline as a level of serum 25-OH vitamin D less than 20 ng/mL (1,2). The Endocrine Society went on to further define vitamin D insufficiency as a level between 21 and 29 ng/mL (2). 1. IOM (Institute of Medicine). 2010. Dietary reference    intakes for calcium and D. [place]: The    [employer]. 2. Holick MF, Binkley Hinton, Bischoff-Ferrari HA, et al.    Evaluation, treatment, and prevention of vitamin D    deficiency: an Endocrine Society clinical practice    guideline. JCEM. 2011 Jul; 96(7):1911-30.   Marland Kitchen Ferritin 02/23/2015 5* 15 - 150 ng/mL Final      STUDIES: [place] Extrem Low Left Ltd  2015/03/26  No pictures taken, entered in error   ASSESSMENT and MEDICAL DECISION MAKING:  Iron deficiency anemia-patient clearly has iron deficiency anemia with very low ferritin, likely from heavy menstrual bleeding. She started iron supplementation less than 2 weeks ago, and should continue oral iron supplementation for another 4 weeks  or so, at that point we will have her return to our clinic to check CBC and ferritin. The patient will try iron supplementation on empty stomach patient will try to take iron supplementation on an empty stomach, however, if she develops constipation or abdominal discomfort, she should be able to take iron supplementation with food, which is usually better tolerated. If she has a good response to oral iron supplementation, she will need to stay on a low dose of iron orally for as long as she has menstrual bleedings. If, however, she does not have a good response, we will discuss intravenous iron.  Heavy menstrual bleeding-patient does not want to pursue any surgical options or oral contraceptive pills.  Patient expressed understanding and was in agreement with this plan. She also understands that She can call clinic at any time with any questions, concerns, or complaints.    No matching staging information was found for the patient.  Roxana Hires, MD   03/02/2015 10:49 AM

## 2015-03-16 ENCOUNTER — Ambulatory Visit: Payer: Self-pay | Admitting: Family Medicine

## 2015-03-23 ENCOUNTER — Encounter: Payer: Self-pay | Admitting: Emergency Medicine

## 2015-03-23 DIAGNOSIS — F410 Panic disorder [episodic paroxysmal anxiety] without agoraphobia: Secondary | ICD-10-CM | POA: Diagnosis not present

## 2015-03-23 DIAGNOSIS — Z791 Long term (current) use of non-steroidal anti-inflammatories (NSAID): Secondary | ICD-10-CM | POA: Insufficient documentation

## 2015-03-23 DIAGNOSIS — R42 Dizziness and giddiness: Secondary | ICD-10-CM | POA: Diagnosis present

## 2015-03-23 DIAGNOSIS — Z79899 Other long term (current) drug therapy: Secondary | ICD-10-CM | POA: Diagnosis not present

## 2015-03-23 LAB — BASIC METABOLIC PANEL
ANION GAP: 7 (ref 5–15)
BUN: 19 mg/dL (ref 6–20)
CALCIUM: 9.1 mg/dL (ref 8.9–10.3)
CO2: 25 mmol/L (ref 22–32)
Chloride: 109 mmol/L (ref 101–111)
Creatinine, Ser: 0.91 mg/dL (ref 0.44–1.00)
GFR calc Af Amer: >60 mL/min (ref >60)
GLUCOSE: 101 mg/dL — AB (ref 65–99)
Potassium: 3.8 mmol/L (ref 3.5–5.1)
Sodium: 141 mmol/L (ref 135–145)

## 2015-03-23 LAB — GLUCOSE, CAPILLARY: Glucose-Capillary: 93 mg/dL (ref 65–99)

## 2015-03-23 NOTE — ED Notes (Signed)
Pt presents to ED with c/o dizziness starting around 2200. Pt states she has not been feeling well today. Pt thinks it might be related to her anxiety. Pt also reports this he 3rd of heavy periods. No neuro deficits noted.

## 2015-03-24 ENCOUNTER — Emergency Department
Admission: EM | Admit: 2015-03-24 | Discharge: 2015-03-24 | Disposition: A | Discharge status: Home / Self Care | Payer: Managed Care, Other (non HMO) | Attending: Emergency Medicine | Admitting: Emergency Medicine

## 2015-03-24 DIAGNOSIS — F410 Panic disorder [episodic paroxysmal anxiety] without agoraphobia: Secondary | ICD-10-CM

## 2015-03-24 LAB — CBC
HEMATOCRIT: 34.8 % — AB (ref 35.0–47.0)
Hemoglobin: 11.5 g/dL — ABNORMAL LOW (ref 12.0–16.0)
MCH: 26.3 pg (ref 26.0–34.0)
MCHC: 32.9 g/dL (ref 32.0–36.0)
MCV: 80 fL (ref 80.0–100.0)
Platelets: 261 10*3/uL (ref 150–440)
RBC: 4.35 MIL/uL (ref 3.80–5.20)
RDW: 16.7 % — AB (ref 11.5–14.5)
WBC: 6.9 10*3/uL (ref 3.6–11.0)

## 2015-03-24 LAB — URINALYSIS COMPLETE WITH MICROSCOPIC (ARMC ONLY)
BACTERIA UA: NONE SEEN
BILIRUBIN URINE: NEGATIVE
Glucose, UA: NEGATIVE mg/dL
LEUKOCYTES UA: NEGATIVE
Nitrite: NEGATIVE
PH: 8 (ref 5.0–8.0)
PROTEIN: NEGATIVE mg/dL
Specific Gravity, Urine: 1.003 — ABNORMAL LOW (ref 1.005–1.030)

## 2015-03-24 MED ORDER — ALPRAZOLAM 0.5 MG PO TABS: 0.5000 mg | ORAL_TABLET | Freq: Two times a day (BID) | ORAL | Status: DC | PRN

## 2015-03-24 NOTE — Discharge Instructions (Signed)

## 2015-03-24 NOTE — ED Provider Notes (Signed)
[facility] Emergency Department Provider Note  ____________________________________________  Time seen: 1:30 AM  I have reviewed the triage vital signs and the nursing notes.   HISTORY  Chief Complaint Dizziness      HPI Ariel Lawson is a 50 y.o. female resents with "panic attack tonight at 10:00 PM". Patient states that she's had panic attacks in the past and this was consistent with such. Patient states that she experienced her heart racing feeling panicked hand tingling breathing rapidly which lasted approximately one hour with spontaneous resolution. Patient states that she is currently under a great deal of "stress"    Past Medical History  Diagnosis Date  . History of chicken pox   . Herpes simplex   . Fibroids 2015    uterus- cyst  . Ovarian cyst     right  . Anemia     IDA    Patient Active Problem List   Diagnosis Date Noted  . Greater trochanteric bursitis of right hip 02/23/2015  . Dysphagia 11/12/2014  . Obesity 11/12/2014  . Palpitations 11/12/2014  . Reflux esophagitis 11/12/2014  . Back muscle spasm 11/12/2014  . Edema 08/20/2006  . Anemia, iron deficiency 08/07/2006  . 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 GERD (gastroesophageal reflux disease) 02/05/2002  . Uncomplicated herpes simplex 02/05/2001    Past Surgical History  Procedure Laterality Date  . Leep  2007    Leep cone Biopsy of Cervix  . Screning mammogram  09/2012    ordered through [place] employer per pt report    Current Outpatient Rx  Name  Route  Sig  Dispense  Refill  . Diclofenac Sodium 2 % SOLN      Apply to affected area twice daily   1 Bottle   2   . Iron Polysacch Cmplx-B12-FA (FERREX 150 FORTE) 150-0.025-1 MG CAPS   Oral   Take 1 capsule by mouth 2 (two) times daily.         . RABEprazole (ACIPHEX) 20 MG tablet   Oral   Take 1 tablet by mouth daily.         . Turmeric 500 MG CAPS   Oral   Take 1 Dose by mouth 2 (two)  times daily.           Allergies Prevacid   Family History  Problem Relation Age of Onset  . Hypertension Mother   . Diabetes Mother     type 2  . Hyperlipidemia Mother   . Anemia Mother   . Lung cancer Father   . Melanoma Sister     in remission x 8 years. Diagnosed in her 43's  . Hypothyroidism Sister   . Thyroid cancer Sister   . Hypothyroidism Sister     Social History Social History  Substance Use Topics  . Smoking status: Never Smoker   . Smokeless tobacco: Never Used  . Alcohol Use: No    Review of Systems  Constitutional: Negative for fever. Eyes: Negative for visual changes. ENT: Negative for sore throat. Cardiovascular: Negative for chest pain. Respiratory: Negative for shortness of breath. Gastrointestinal: Negative for abdominal pain, vomiting and diarrhea. Genitourinary: Negative for dysuria. Musculoskeletal: Negative for back pain. Skin: Negative for rash. Neurological: Negative for headaches, focal weakness or numbness. Psychiatric: Positive for panic attack  10-point ROS otherwise negative.  ____________________________________________   PHYSICAL EXAM:  VITAL SIGNS: ED Triage Vitals  Enc Vitals Group     BP 03/23/15 2257 130/80 mmHg  Pulse Rate 03/23/15 2257 92     Resp 03/23/15 2257 16     Temp 03/23/15 2257 98 F (36.7 C)     Temp Source 03/23/15 2257 Oral     SpO2 03/23/15 2257 99 %     Weight --      Height --      Head Cir --      Peak Flow --      Pain Score 03/23/15 2259 0     Pain Loc --      Pain Edu? --      Excl. in [place]? --     Constitutional: Alert and oriented. Well appearing and in no distress. Eyes: Conjunctivae are normal. PERRL. Normal extraocular movements. ENT   Head: Normocephalic and atraumatic.   Nose: No congestion/rhinnorhea.   Mouth/Throat: Mucous membranes are moist.   Neck: No stridor. Hematological/Lymphatic/Immunilogical: No cervical lymphadenopathy. Cardiovascular: Normal rate,  regular rhythm. Normal and symmetric distal pulses are present in all extremities. No murmurs, rubs, or gallops. Respiratory: Normal respiratory effort without tachypnea nor retractions. Breath sounds are clear and equal bilaterally. No wheezes/rales/rhonchi. Gastrointestinal: Soft and nontender. No distention. There is no CVA tenderness. Genitourinary: deferred Musculoskeletal: Nontender with normal range of motion in all extremities. No joint effusions.  No lower extremity tenderness nor edema. Neurologic:  Normal speech and language. No gross focal neurologic deficits are appreciated. Speech is normal.  Skin:  Skin is warm, dry and intact. No rash noted. Psychiatric: Mood and affect are normal. Speech and behavior are normal. Patient exhibits appropriate insight and judgment.  ____________________________________________    LABS (pertinent positives/negatives)  Labs Reviewed  BASIC METABOLIC PANEL - Abnormal; Notable for the following:    Glucose, Bld 101 (*)    All other components within normal limits  CBC - Abnormal; Notable for the following:    Hemoglobin 11.5 (*)    HCT 34.8 (*)    RDW 16.7 (*)    All other components within normal limits  URINALYSIS COMPLETEWITH MICROSCOPIC ([facility] ONLY) - Abnormal; Notable for the following:    Color, Urine STRAW (*)    APPearance CLEAR (*)    Ketones, ur TRACE (*)    Specific Gravity, Urine 1.003 (*)    Hgb urine dipstick 3+ (*)    Squamous Epithelial / LPF 0-5 (*)    All other components within normal limits  GLUCOSE, CAPILLARY  CBG MONITORING, ED     ____________________________________________   EKG  ED ECG REPORT I, BROWN, [place] N, the attending physician, personally viewed and interpreted this ECG.   Date: 03/24/2015  EKG Time: 11:16 PM  Rate: 84  Rhythm: Normal Sinus rhythm  Axis: Normal  Intervals: Normal  ST&T Change: None     INITIAL IMPRESSION / ASSESSMENT AND PLAN / ED COURSE  Pertinent labs & imaging  results that were available during my care of the patient were reviewed by me and considered in my medical decision making (see chart for details).    ____________________________________________   FINAL CLINICAL IMPRESSION(S) / ED DIAGNOSES  Final diagnoses:  Panic attacks      Gregor Hams, MD 03/24/15 [phone]

## 2015-03-24 NOTE — ED Notes (Signed)
Pt states, "I feel better and am ready to go home and sleep."

## 2015-03-28 ENCOUNTER — Other Ambulatory Visit: Payer: Self-pay | Admitting: Family Medicine

## 2015-03-31 ENCOUNTER — Other Ambulatory Visit: Payer: Self-pay

## 2015-03-31 ENCOUNTER — Ambulatory Visit: Payer: Self-pay

## 2015-04-01 ENCOUNTER — Other Ambulatory Visit: Payer: Self-pay

## 2015-04-01 ENCOUNTER — Ambulatory Visit: Payer: Self-pay

## 2015-04-13 ENCOUNTER — Ambulatory Visit: Payer: Self-pay | Admitting: Physician Assistant

## 2015-04-13 DIAGNOSIS — D509 Iron deficiency anemia, unspecified: Secondary | ICD-10-CM

## 2015-04-14 ENCOUNTER — Other Ambulatory Visit: Payer: Self-pay | Admitting: Internal Medicine

## 2015-04-14 ENCOUNTER — Telehealth: Payer: Self-pay | Admitting: *Deleted

## 2015-04-14 DIAGNOSIS — D509 Iron deficiency anemia, unspecified: Secondary | ICD-10-CM

## 2015-04-14 LAB — CBC WITH DIFFERENTIAL/PLATELET
Basophils Absolute: 0 10*3/uL (ref 0.0–0.2)
Basos: 1 %
EOS (ABSOLUTE): 0.1 10*3/uL (ref 0.0–0.4)
EOS: 2 %
HEMATOCRIT: 36.4 % (ref 34.0–46.6)
Hemoglobin: 11.9 g/dL (ref 11.1–15.9)
Immature Grans (Abs): 0 10*3/uL (ref 0.0–0.1)
Immature Granulocytes: 0 %
LYMPHS ABS: 1.7 10*3/uL (ref 0.7–3.1)
Lymphs: 27 %
MCH: 26.7 pg (ref 26.6–33.0)
MCHC: 32.7 g/dL (ref 31.5–35.7)
MCV: 82 fL (ref 79–97)
MONOS ABS: 0.5 10*3/uL (ref 0.1–0.9)
Monocytes: 7 %
Neutrophils Absolute: 3.9 10*3/uL (ref 1.4–7.0)
Neutrophils: 63 %
Platelets: 264 10*3/uL (ref 150–379)
RBC: 4.45 x10E6/uL (ref 3.77–5.28)
RDW: 16.5 % — AB (ref 12.3–15.4)
WBC: 6.2 10*3/uL (ref 3.4–10.8)

## 2015-04-14 LAB — IRON AND TIBC
Iron Saturation: 12 % — ABNORMAL LOW (ref 15–55)
Iron: 49 ug/dL (ref 27–159)
Total Iron Binding Capacity: 410 ug/dL (ref 250–450)
UIBC: 361 ug/dL (ref 131–425)

## 2015-04-14 LAB — FERRITIN: Ferritin: 4 ng/mL — ABNORMAL LOW (ref 15–150)

## 2015-04-14 NOTE — Telephone Encounter (Signed)
Per Dr B no other labs needed. He will see her on 3/22. And wanted to add IV Iron to that appt, but she refused to get iron that day just wanting to see md first to discuss it

## 2015-04-14 NOTE — Telephone Encounter (Signed)
ASking if [facility]llod [facility] is sufficient for what we need and if not she needs to know so it can be added to blood they have already drawn

## 2015-04-14 NOTE — Progress Notes (Signed)
E-mailed Ariel Lawson her result as she instructed me to do

## 2015-04-14 NOTE — Progress Notes (Signed)
Seen by Stacy Gardner, see written notes

## 2015-04-20 ENCOUNTER — Ambulatory Visit: Payer: Self-pay

## 2015-04-27 ENCOUNTER — Ambulatory Visit: Payer: Self-pay

## 2015-04-27 ENCOUNTER — Ambulatory Visit: Payer: Self-pay | Admitting: Internal Medicine

## 2015-06-07 ENCOUNTER — Telehealth: Payer: Self-pay | Admitting: *Deleted

## 2015-06-07 MED ORDER — POLYSACCHAR IRON-FA-B12 150-1-25 MG-MG-MCG PO CAPS: 1.0000 | ORAL_CAPSULE | Freq: Every day | ORAL | Status: DC

## 2015-06-07 NOTE — Telephone Encounter (Signed)
Requesting 90 supply of Ferrex 150 forte capsule, sent to [facility] pharmacy [address].

## 2015-06-08 ENCOUNTER — Other Ambulatory Visit: Payer: Self-pay | Admitting: Family Medicine

## 2015-06-08 MED ORDER — POLYSACCHAR IRON-FA-B12 150-1-25 MG-MG-MCG PO CAPS: 1.0000 | ORAL_CAPSULE | Freq: Two times a day (BID) | ORAL | Status: DC

## 2015-06-18 ENCOUNTER — Other Ambulatory Visit: Payer: Self-pay | Admitting: Family Medicine

## 2015-07-12 ENCOUNTER — Encounter: Payer: Self-pay | Admitting: Family Medicine

## 2015-07-12 ENCOUNTER — Ambulatory Visit (INDEPENDENT_AMBULATORY_CARE_PROVIDER_SITE_OTHER): Payer: Managed Care, Other (non HMO) | Admitting: Family Medicine

## 2015-07-12 VITALS — BP 110/62 | HR 90 | Temp 98.6°F | Resp 16 | Ht 63.0 in | Wt 189.0 lb

## 2015-07-12 DIAGNOSIS — F410 Panic disorder [episodic paroxysmal anxiety] without agoraphobia: Secondary | ICD-10-CM | POA: Diagnosis not present

## 2015-07-12 DIAGNOSIS — I491 Atrial premature depolarization: Secondary | ICD-10-CM | POA: Diagnosis not present

## 2015-07-12 DIAGNOSIS — D509 Iron deficiency anemia, unspecified: Secondary | ICD-10-CM

## 2015-07-12 DIAGNOSIS — R000 Tachycardia, unspecified: Secondary | ICD-10-CM

## 2015-07-12 MED ORDER — ALPRAZOLAM 0.25 MG PO TABS: ORAL_TABLET | ORAL | Status: DC

## 2015-07-12 NOTE — Progress Notes (Signed)
       Patient: Ariel Lawson Female    DOB: 1966-02-04   50 y.o.   MRN: [id number] Visit Date: 07/12/2015  Today's Provider: Lelon Huh, MD   Chief Complaint  Patient presents with  . Follow-up  . Gastroesophageal Reflux  . Anemia   Subjective:    HPI  Panic attack Patient reports she was sitting at her desk at work yesterday which she suddenly felt very anxious and her heart started pounding. She had an EKG done by EMT showing PACs and slightly tachycardic at 103. She states acquaintance did some type of '[employer] [employer]" treatment and symptoms finally resolved. She has history of panic attacks and was seen in ER for similar incident in February. She states she was given rx for xanax from ER, but she never had it filled.  Follow-up for GERD from 12/11/2012; no changes, continue current medications.Has no reflux symptoms on current medications.    Follow-up for anemia from 12/11/2012; no changes, continue current medications. Has started taking at least 1 iron tablet a day and is eating more red meat.     Allergies  Allergen Reactions  . Prevacid  [Lansoprazole] Itching   Previous Medications   POLYSACCHAR IRON-FA-B12 (FERREX 150 FORTE) 150-1-25 MG-MG-MCG CAPS    Take 1 tablet by mouth 2 (two) times daily.   RABEPRAZOLE (ACIPHEX) 20 MG TABLET    TAKE 1 TABLET BY MOUTH EVERY DAY    Review of Systems  Constitutional: Negative for fever, chills, appetite change and fatigue.  Respiratory: Negative for chest tightness and shortness of breath.   Cardiovascular: Negative for chest pain and palpitations.  Gastrointestinal: Negative for nausea, vomiting and abdominal pain.  Neurological: Negative for dizziness and weakness.    Social History  Substance Use Topics  . Smoking status: Never Smoker   . Smokeless tobacco: Never Used  . Alcohol Use: No   Objective:   BP 110/62 mmHg  Pulse 90  Temp(Src) 98.6 F (37 C) (Oral)  Resp 16  Ht 5\' 3"  (1.6 m)  Wt 189 lb (85.73 kg)   BMI 33.49 kg/m2  SpO2 99%  LMP 06/16/2015  Physical Exam   General Appearance:    Alert, cooperative, no distress  Eyes:    PERRL, conjunctiva/corneas clear, EOM's intact       Lungs:     Clear to auscultation bilaterally, respirations unlabored  Heart:    Regular rate and rhythm  Neurologic:   Awake, alert, oriented x 3. No apparent focal neurological           defect.       EKG: NSR    Assessment & Plan:     1. Tachycardia Secondary to anxiety attack, now resolved.  - EKG 12-Lead  2. PAC (premature atrial contraction)   3. Anemia, iron deficiency Now getting supplemental iron consistently.  - CBC - Ferritin - Vitamin B12  4. Panic attack  - ALPRAZolam (XANAX) 0.25 MG tablet; As needed for panic attack, may take another dose in 30 minutes if needed.  Dispense: 6 tablet; Refill: 0     The entirety of the information documented in the History of Present Illness, Review of Systems and Physical Exam were personally obtained by me. Portions of this information were initially documented by Meyer Cory, CMA and reviewed by me for thoroughness and accuracy.    Lelon Huh, MD  [facility]

## 2015-07-13 ENCOUNTER — Other Ambulatory Visit: Payer: Self-pay

## 2015-07-13 DIAGNOSIS — D509 Iron deficiency anemia, unspecified: Secondary | ICD-10-CM

## 2015-07-13 DIAGNOSIS — F410 Panic disorder [episodic paroxysmal anxiety] without agoraphobia: Secondary | ICD-10-CM | POA: Insufficient documentation

## 2015-07-13 NOTE — Progress Notes (Signed)
Patient came in to have blood drawn per Dr. Lelon Huh from [place] orders.  Blood was drawn from the right arm without any incident.

## 2015-07-14 ENCOUNTER — Encounter: Payer: Self-pay | Admitting: Family Medicine

## 2015-07-14 LAB — CBC WITH DIFFERENTIAL/PLATELET
BASOS: 1 %
Basophils Absolute: 0 10*3/uL (ref 0.0–0.2)
EOS (ABSOLUTE): 0.2 10*3/uL (ref 0.0–0.4)
EOS: 3 %
HEMOGLOBIN: 12.2 g/dL (ref 11.1–15.9)
Hematocrit: 37.5 % (ref 34.0–46.6)
Immature Grans (Abs): 0 10*3/uL (ref 0.0–0.1)
Immature Granulocytes: 0 %
LYMPHS ABS: 2.1 10*3/uL (ref 0.7–3.1)
Lymphs: 27 %
MCH: 28.1 pg (ref 26.6–33.0)
MCHC: 32.5 g/dL (ref 31.5–35.7)
MCV: 86 fL (ref 79–97)
MONOCYTES: 5 %
MONOS ABS: 0.4 10*3/uL (ref 0.1–0.9)
Neutrophils Absolute: 4.9 10*3/uL (ref 1.4–7.0)
Neutrophils: 64 %
Platelets: 284 10*3/uL (ref 150–379)
RBC: 4.34 x10E6/uL (ref 3.77–5.28)
RDW: 15.8 % — ABNORMAL HIGH (ref 12.3–15.4)
WBC: 7.6 10*3/uL (ref 3.4–10.8)

## 2015-07-14 LAB — VITAMIN B12: Vitamin B-12: 873 pg/mL (ref 211–946)

## 2015-07-14 LAB — FERRITIN: FERRITIN: 17 ng/mL (ref 15–150)

## 2015-10-17 ENCOUNTER — Encounter: Payer: Self-pay | Admitting: Physician Assistant

## 2015-10-17 ENCOUNTER — Ambulatory Visit: Payer: Self-pay | Admitting: Physician Assistant

## 2015-10-17 VITALS — BP 117/72 | HR 86 | Temp 98.6°F

## 2015-10-17 DIAGNOSIS — J069 Acute upper respiratory infection, unspecified: Secondary | ICD-10-CM

## 2015-10-17 MED ORDER — METHYLPREDNISOLONE 4 MG PO TBPK: ORAL_TABLET | ORAL | 0 refills | Status: DC

## 2015-10-17 MED ORDER — AMOXICILLIN 400 MG/5ML PO SUSR: ORAL | 0 refills | Status: DC

## 2015-10-17 NOTE — Progress Notes (Signed)
S: C/o sore throat, runny nose and congestion with dry cough for 4-5 days, no fever, chills, cp/sob, v/d; states can hear herself rattling at night, mucus was yellow this morninging, cough is sporadic,   Using otc meds: robitussin  O: PE: vitals wnl, nad, perrl eomi, normocephalic, tms dull, nasal mucosa red and swollen, throat injected, neck supple no lymph, lungs c t a, cv rrr, neuro intact  A:  Acute viral uri   P: drink fluids, continue regular meds , use otc meds of choice, return if not improving in 5 days, return earlier if worsening . Amoxil 400/5 2 1/4 tsp bid, medrol dose pack

## 2015-12-06 ENCOUNTER — Ambulatory Visit: Payer: Self-pay | Admitting: Physician Assistant

## 2015-12-06 ENCOUNTER — Encounter: Payer: Self-pay | Admitting: Physician Assistant

## 2015-12-06 VITALS — BP 118/65 | HR 80 | Temp 98.4°F

## 2015-12-06 DIAGNOSIS — H1031 Unspecified acute conjunctivitis, right eye: Secondary | ICD-10-CM

## 2015-12-06 MED ORDER — TOBRAMYCIN 0.3 % OP SOLN: 2.0000 [drp] | OPHTHALMIC | 0 refills | Status: DC

## 2015-12-06 NOTE — Progress Notes (Addendum)
S:  C/o right eye being irritated, matted with yellow discharge, draining all day with yellow discharge, sx started last pm, denies, cough, congestion, fever, chills, v/d; remainder ros neg  O: vitals wnl, nad, perrl eomi, right eye with injected conjunctiva, no drainage or matting noted at this time, tms clear, nasal mucosa inflamed, throat wnl, neck supple no lymph, lungs c t a, cv rrr  A:  Acute conjunctivitis  P: tobramycin opth gtts, return if not better in 3 - 5d, if worsening return earlier or see eye doctor, no work until eye is no longer red or draining, work note given as needs to be out of work remainder of today and tomorrow  Pt called and said the tobramycin drops give her a headache when she uses them, ?if we can change antibiotic drop, called in bleph 10 opth gtts to [employer]

## 2015-12-09 MED ORDER — SULFACETAMIDE SODIUM 10 % OP SOLN: 2.0000 [drp] | OPHTHALMIC | 0 refills | Status: DC

## 2015-12-09 NOTE — Addendum Note (Signed)
Addended by: Versie Starks on: 12/09/2015 10:30 AM   Modules accepted: Orders

## 2016-03-29 ENCOUNTER — Encounter: Payer: Self-pay | Admitting: Physician Assistant

## 2016-03-29 ENCOUNTER — Ambulatory Visit: Payer: Self-pay | Admitting: Physician Assistant

## 2016-03-29 VITALS — BP 100/70 | HR 70 | Temp 98.4°F | Ht 63.0 in | Wt 190.0 lb

## 2016-03-29 DIAGNOSIS — Z299 Encounter for prophylactic measures, unspecified: Secondary | ICD-10-CM

## 2016-03-29 NOTE — Progress Notes (Signed)
S: c/o sore muscles, pain, for 4-5 weeks, pain is severe, worse near hip where she has bursitis, no known injury, is also really tired,  no fever/chills, no new meds or foods, states she eats organic as much as possible, cleans foods really well, states her anxiety has increased bc she is working longer than usual + she added a spanish class every Mon and WEd for 3 hours at night, has tried otc meds without rellief  O: vitals wnl, nad, muscles in back, shoulders, thighs tender to palp, full rom, n/v intact, lungs c t a, cv rrr  A: fatigue, muscle pain  P: labs today, soak in epsom salts and warm water

## 2016-03-31 LAB — CMP12+LP+TP+TSH+6AC+CBC/D/PLT
ALBUMIN: 4.7 g/dL (ref 3.5–5.5)
ALK PHOS: 67 IU/L (ref 39–117)
ALT: 13 IU/L (ref 0–32)
AST: 14 IU/L (ref 0–40)
Albumin/Globulin Ratio: 1.7 (ref 1.2–2.2)
BASOS: 1 %
BUN/Creatinine Ratio: 14 (ref 9–23)
BUN: 11 mg/dL (ref 6–24)
Basophils Absolute: 0 10*3/uL (ref 0.0–0.2)
Bilirubin Total: 0.3 mg/dL (ref 0.0–1.2)
CALCIUM: 9.7 mg/dL (ref 8.7–10.2)
CHLORIDE: 97 mmol/L (ref 96–106)
CHOL/HDL RATIO: 3 ratio (ref 0.0–4.4)
CREATININE: 0.77 mg/dL (ref 0.57–1.00)
Cholesterol, Total: 179 mg/dL (ref 100–199)
EOS (ABSOLUTE): 0.2 10*3/uL (ref 0.0–0.4)
Eos: 2 %
Free Thyroxine Index: 2.6 (ref 1.2–4.9)
GFR calc Af Amer: 105 mL/min/{1.73_m2} (ref >59)
GFR, EST NON AFRICAN AMERICAN: 91 mL/min/{1.73_m2} (ref >59)
GGT: 10 IU/L (ref 0–60)
Globulin, Total: 2.8 g/dL (ref 1.5–4.5)
Glucose: 85 mg/dL (ref 65–99)
HDL: 60 mg/dL (ref >39)
HEMATOCRIT: 36.2 % (ref 34.0–46.6)
Hemoglobin: 11.9 g/dL (ref 11.1–15.9)
IMMATURE GRANS (ABS): 0 10*3/uL (ref 0.0–0.1)
Immature Granulocytes: 0 %
Iron: 33 ug/dL (ref 27–159)
LDH: 161 IU/L (ref 119–226)
LDL CALC: 107 mg/dL — AB (ref 0–99)
LYMPHS ABS: 2.5 10*3/uL (ref 0.7–3.1)
Lymphs: 30 %
MCH: 27 pg (ref 26.6–33.0)
MCHC: 32.9 g/dL (ref 31.5–35.7)
MCV: 82 fL (ref 79–97)
MONOS ABS: 0.6 10*3/uL (ref 0.1–0.9)
Monocytes: 7 %
NEUTROS ABS: 5.1 10*3/uL (ref 1.4–7.0)
Neutrophils: 60 %
PHOSPHORUS: 3.4 mg/dL (ref 2.5–4.5)
POTASSIUM: 3.8 mmol/L (ref 3.5–5.2)
Platelets: 305 10*3/uL (ref 150–379)
RBC: 4.4 x10E6/uL (ref 3.77–5.28)
RDW: 15.1 % (ref 12.3–15.4)
SODIUM: 140 mmol/L (ref 134–144)
T3 Uptake Ratio: 25 % (ref 24–39)
T4 TOTAL: 10.5 ug/dL (ref 4.5–12.0)
TSH: 1.8 u[IU]/mL (ref 0.450–4.500)
Total Protein: 7.5 g/dL (ref 6.0–8.5)
Triglycerides: 62 mg/dL (ref 0–149)
Uric Acid: 2.8 mg/dL (ref 2.5–7.1)
VLDL Cholesterol Cal: 12 mg/dL (ref 5–40)
WBC: 8.4 10*3/uL (ref 3.4–10.8)

## 2016-03-31 LAB — RHEUMATOID FACTOR: Rhuematoid fact SerPl-aCnc: <10 IU/mL (ref 0.0–13.9)

## 2016-03-31 LAB — HIV ANTIBODY (ROUTINE TESTING W REFLEX): HIV SCREEN 4TH GENERATION: NONREACTIVE

## 2016-03-31 LAB — ANA W/REFLEX IF POSITIVE: Anti Nuclear Antibody(ANA): NEGATIVE

## 2016-03-31 LAB — EPSTEIN-BARR VIRUS VCA ANTIBODY PANEL
EBV Early Antigen Ab, IgG: 22.1 U/mL — ABNORMAL HIGH (ref 0.0–8.9)
EBV NA IgG: 173 U/mL — ABNORMAL HIGH (ref 0.0–17.9)
EBV VCA IgG: 51.2 U/mL — ABNORMAL HIGH (ref 0.0–17.9)

## 2016-03-31 LAB — HCV COMMENT:

## 2016-03-31 LAB — CK TOTAL AND CKMB (NOT AT ARMC)
CK-MB Index: 1.1 ng/mL (ref 0.0–5.3)
Total CK: 68 U/L (ref 24–173)

## 2016-03-31 LAB — VITAMIN D 25 HYDROXY (VIT D DEFICIENCY, FRACTURES): Vit D, 25-Hydroxy: 29.7 ng/mL — ABNORMAL LOW (ref 30.0–100.0)

## 2016-03-31 LAB — FERRITIN: FERRITIN: 7 ng/mL — AB (ref 15–150)

## 2016-03-31 LAB — B. BURGDORFI ANTIBODIES

## 2016-03-31 LAB — HEPATITIS C ANTIBODY (REFLEX)

## 2016-04-23 ENCOUNTER — Ambulatory Visit
Admission: RE | Admit: 2016-04-23 | Discharge: 2016-04-23 | Disposition: A | Discharge status: Home / Self Care | Payer: Managed Care, Other (non HMO) | Source: Ambulatory Visit | Attending: Family Medicine | Admitting: Family Medicine

## 2016-04-23 ENCOUNTER — Encounter: Payer: Self-pay | Admitting: Family Medicine

## 2016-04-23 ENCOUNTER — Telehealth: Payer: Self-pay

## 2016-04-23 ENCOUNTER — Ambulatory Visit (INDEPENDENT_AMBULATORY_CARE_PROVIDER_SITE_OTHER): Payer: Managed Care, Other (non HMO) | Admitting: Family Medicine

## 2016-04-23 VITALS — BP 112/68 | Temp 98.7°F | Resp 16 | Ht 64.0 in | Wt 186.0 lb

## 2016-04-23 DIAGNOSIS — Z0000 Encounter for general adult medical examination without abnormal findings: Secondary | ICD-10-CM | POA: Diagnosis not present

## 2016-04-23 DIAGNOSIS — Z1231 Encounter for screening mammogram for malignant neoplasm of breast: Secondary | ICD-10-CM

## 2016-04-23 DIAGNOSIS — Z1239 Encounter for other screening for malignant neoplasm of breast: Secondary | ICD-10-CM

## 2016-04-23 DIAGNOSIS — M25551 Pain in right hip: Secondary | ICD-10-CM

## 2016-04-23 DIAGNOSIS — D649 Anemia, unspecified: Secondary | ICD-10-CM

## 2016-04-23 DIAGNOSIS — Z124 Encounter for screening for malignant neoplasm of cervix: Secondary | ICD-10-CM

## 2016-04-23 NOTE — Telephone Encounter (Signed)
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 Recommend referral to physical therapy if she doesn't want to get cortisone shot.

## 2016-04-23 NOTE — Progress Notes (Addendum)
Patient: Ariel Lawson, Female    DOB: [birth year], 51 y.o.   MRN: [id number] Visit Date: 04/23/2016  Today's Provider: Lelon Huh, MD   Chief Complaint  Patient presents with  . Annual Exam  . Anemia   Subjective:    Annual physical exam Ariel Lawson is a 51 y.o. female who presents today for health maintenance and complete physical. She feels well. She reports exercising occasionally. She reports she is sleeping well.  She also complains of persistent pain right lateral hip for the last few years. Was seen by DO (Dr. Hulan Saas) last year who she states recommended steroid injection, but she doesn't want to take or have any medication injected into her. She hasn't had Xray of hip which she would like to have done.   She does have chronic iron deficiency anemia Lab Results  Component Value Date   FERRITIN 7 (L) 03/29/2016   Lab Results  Component Value Date   WBC 8.4 03/29/2016   HGB 11.5 (L) 03/23/2015   HCT 36.2 03/29/2016   MCV 82 03/29/2016   PLT 305 03/29/2016    She states that she has started back on iron supplements twice a day since ferritin was checked last month She apparently had a spell in early February when she was extremely fatigued and achy all over. She [facility] which were remarkable for low ferritin as above, and very high EBV NA antibody titers. She states that several days after labs were drawn she started having perfuse sweats for about a day, and when sweats stopped all of her aches and pains went away.   Review of Systems  Constitutional: Negative.   HENT: Negative.   Eyes: Negative.   Respiratory: Negative.   Cardiovascular: Negative.   Gastrointestinal: Negative.   Endocrine: Negative.   Genitourinary: Negative.   Musculoskeletal: Positive for arthralgias.  Skin: Negative.   Allergic/Immunologic: Negative.   Neurological: Negative.   Hematological: Negative.   Psychiatric/Behavioral:  Negative.     Social History      She  reports that she has never smoked. She has never used smokeless tobacco. She reports that she does not drink alcohol or use drugs.       Social History   Social History  . Marital status: Married    Spouse name: N/A  . Number of children: 4  . Years of education: N/A   Social History Main Topics  . Smoking status: Never Smoker  . Smokeless tobacco: Never Used  . Alcohol use No  . Drug use: No  . Sexual activity: Not Asked   Other Topics Concern  . None   Social History Narrative  . None    Past Medical History:  Diagnosis Date  . Anemia    IDA  . Fibroids 2015   uterus- cyst  . Herpes simplex   . History of chicken pox   . Ovarian cyst    right     Patient Active Problem List   Diagnosis Date Noted  . Panic attack 07/13/2015  . PAC (premature atrial contraction) 07/12/2015  . Greater trochanteric bursitis of right hip 02/23/2015  . Dysphagia 11/12/2014  . Obesity 11/12/2014  . Palpitations 11/12/2014  . Reflux esophagitis 11/12/2014  . Back muscle spasm 11/12/2014  . Edema 08/20/2006  . Anemia, iron deficiency 08/07/2006  . 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 GERD (gastroesophageal reflux disease) 02/05/2002  . Uncomplicated herpes simplex  02/05/2001    Past Surgical History:  Procedure Laterality Date  . LEEP  2007   Leep cone Biopsy of Cervix  . screning mammogram  09/2012   ordered through [place] employer per pt report    Family History        Family Status  Relation Status  . Mother Alive   Myxedema  . Father Deceased   lung cancer  . Sister Alive  . Sister Alive        Her family history includes Anemia in her mother; Diabetes in her mother; Hyperlipidemia in her mother; Hypertension in her mother; Hypothyroidism in her sister and sister; Lung cancer in her father; Melanoma in her sister; Thyroid cancer in her sister.     Allergies  Allergen Reactions  . Prevacid  [Lansoprazole]  Itching     Current Outpatient Prescriptions:  .  Polysacchar Iron-FA-B12 (FERREX 150 FORTE) 150-1-25 MG-MG-MCG CAPS, Take 1 tablet by mouth 2 (two) times daily., Disp: 180 capsule, Rfl: 4 .  RABEprazole (ACIPHEX) 20 MG tablet, TAKE 1 TABLET BY MOUTH EVERY DAY, Disp: 30 tablet, Rfl: 9 .  ALPRAZolam (XANAX) 0.25 MG tablet, As needed for panic attack, may take another dose in 30 minutes if needed. (Patient not taking: Reported on 12/06/2015), Disp: 6 tablet, Rfl: 0 .  amoxicillin (AMOXIL) 400 MG/5ML suspension, 2 1/4 tsp bid (Patient not taking: Reported on 12/06/2015), Disp: 250 mL, Rfl: 0 .  methylPREDNISolone (MEDROL DOSEPAK) 4 MG TBPK tablet, Take 6 pills on day one then decrease by 1 pill each day (Patient not taking: Reported on 12/06/2015), Disp: 21 tablet, Rfl: 0 .  sulfacetamide (BLEPH-10) 10 % ophthalmic solution, Place 2 drops into both eyes every 4 (four) hours. (Patient not taking: Reported on 03/29/2016), Disp: 5 mL, Rfl: 0 .  tobramycin (TOBREX) 0.3 % ophthalmic solution, Place 2 drops into both eyes every 4 (four) hours. (Patient not taking: Reported on 03/29/2016), Disp: 5 mL, Rfl: 0   Patient Care Team: Birdie Sons, MD as PCP - General (Family Medicine) Brendolyn Patty, MD (Dermatology) Tiajuana Amass, MD as Referring Physician (Allergy and Immunology)      Objective:   Vitals: BP 112/68 (BP Location: Left Arm, Patient Position: Sitting, Cuff Size: Normal)   Temp 98.7 F (37.1 C)   Resp 16   Ht 5\' 4"  (1.626 m)   Wt 186 lb (84.4 kg)   LMP 04/19/2016   BMI 31.93 kg/m    Vitals:   04/23/16 0953  BP: 112/68  Resp: 16  Temp: 98.7 F (37.1 C)  Weight: 186 lb (84.4 kg)  Height: 5\' 4"  (1.626 m)     Physical Exam  Constitutional: She is oriented to person, place, and time. She appears well-developed and well-nourished.  Neck: Normal range of motion.  Cardiovascular: Normal rate, regular rhythm and normal heart sounds.   Pulmonary/Chest: Effort normal and breath sounds  normal.  Abdominal: Soft. Bowel sounds are normal.  Genitourinary: Vagina normal. No vaginal discharge (cervix parous) found.  Musculoskeletal: Normal range of motion.  Neurological: She is alert and oriented to person, place, and time.  Skin: Skin is warm and dry.  Psychiatric: She has a normal mood and affect. Her behavior is normal. Judgment and thought content normal.     Depression Screen No flowsheet data found.    Assessment & Plan:     Routine Health Maintenance and Physical Exam  Exercise Activities and Dietary recommendations Goals    None  There is no immunization history on file for this patient.  Health Maintenance  Topic Date Due  . TETANUS/TDAP  12/15/1985  . PAP SMEAR  12/12/2015  . HIV Screening  Completed     Discussed health benefits of physical activity, and encouraged her to engage in regular exercise appropriate for her age and condition.    1. Annual physical exam She refused Tdap  2. Right hip pain Likely trochanteric bursitis. Will check xrays. She is adamantly against steroid injection. Consider physical therapy. - DG HIP UNILAT WITH PELVIS 2-3 VIEWS RIGHT; Future  3. Anemia, unspecified type Iron deficient likely from heavy bleeding with periods. However periods are very regular, always last 3 days, and there is no bleeding between periods.  Continue oral iron replacement.   4. Breast cancer screening  - MM Digital Screening; Future  5. Cervical cancer screening  - Pap IG and HPV (high risk) DNA detection    Lelon Huh, MD  [facility]

## 2016-04-23 NOTE — Patient Instructions (Signed)
Call [facility] to schedule your mammogram at [phone].

## 2016-04-23 NOTE — Telephone Encounter (Signed)
No anwer/unable to LM  Thanks,  -Sabian Kuba

## 2016-04-24 NOTE — Telephone Encounter (Signed)
FYI: Patient advised as directed below. She reports she has an appointment coming up with Dr.Zachary Tamala [facility] and she is going to see what he advises.  Thanks,  -Joseline

## 2016-04-25 LAB — PAP IG AND HPV HIGH-RISK
HPV, high-risk: NEGATIVE
PAP Smear Comment: 0

## 2016-04-30 ENCOUNTER — Ambulatory Visit: Payer: Self-pay | Admitting: Physician Assistant

## 2016-04-30 ENCOUNTER — Encounter: Payer: Self-pay | Admitting: Physician Assistant

## 2016-04-30 VITALS — BP 119/65 | HR 77 | Temp 98.0°F

## 2016-04-30 DIAGNOSIS — R1013 Epigastric pain: Principal | ICD-10-CM

## 2016-04-30 DIAGNOSIS — G8929 Other chronic pain: Secondary | ICD-10-CM

## 2016-04-30 NOTE — Progress Notes (Signed)
S: pt here for continued nausea, bloating, and stomach pains, thinks she is allergic to gluten as this happens more when she eats pasta or pizza, ate pizza  Yesterday and as soon as she finished the first piece her abd became bloated, she had nausea then diarrhea, no cp/sob, no rash, has seen a gi specialist at [facility] when she swallowed an egg and it was stuck, wants [place] to do blood work for celiac disease  O: vitals wnl, nad, neuro intact, lungs c t a, cv rrr  A: abdominal bloating, nausea  P: pt is to call her GI doctor, if needs a referral we will be glad to refer, explained to her that it would be more appropriate for her to see the specialist before we order expensive labs that may not be the right ones

## 2016-05-14 ENCOUNTER — Encounter: Payer: Managed Care, Other (non HMO) | Admitting: Family Medicine

## 2016-06-29 ENCOUNTER — Encounter: Payer: Self-pay | Admitting: Family Medicine

## 2016-06-29 ENCOUNTER — Ambulatory Visit (INDEPENDENT_AMBULATORY_CARE_PROVIDER_SITE_OTHER): Payer: Managed Care, Other (non HMO) | Admitting: Family Medicine

## 2016-06-29 VITALS — BP 120/60 | HR 102 | Temp 98.4°F | Resp 16 | Wt 177.8 lb

## 2016-06-29 DIAGNOSIS — L089 Local infection of the skin and subcutaneous tissue, unspecified: Secondary | ICD-10-CM

## 2016-06-29 DIAGNOSIS — L720 Epidermal cyst: Secondary | ICD-10-CM | POA: Diagnosis not present

## 2016-06-29 NOTE — Progress Notes (Signed)
Subjective:     Patient ID: Ariel Lawson, female   DOB: 1965/10/12, 51 y.o.   MRN: [id number]  HPI  Chief Complaint  Patient presents with  . Cyst    Patient is here today with c/o knot/boil on lower back.She reports that she has been having this knot for two years. She reports that she saw the dermatologist back in April and agreed to remove it. Surgery is scheduled June 11. She reports that now is hurting, red and swollen. She was not able to get in with her Dermatologist because she is not in the office.  No prior hx of MRSA or boils   Review of Systems     Objective:   Physical Exam  Constitutional: She appears well-developed and well-nourished. No distress.  Skin:  Right lower back with inflamed epidermoid cyst Procedure note: Cleansed with Betadine and infiltrated with lidocaine with epi. I &D with small amount of pus and moderate amount of cyst material. Bandaid applied afterwards.       Assessment:    1. Infected epidermoid cyst - INCISION AND DRAINAGE; Future    Plan:    Cleanse with soap and water. F/u with dermatology as scheduled.

## 2016-06-29 NOTE — Patient Instructions (Addendum)
Discussed cleansing with soap and water and applying band-aid.

## 2016-07-03 ENCOUNTER — Ambulatory Visit (INDEPENDENT_AMBULATORY_CARE_PROVIDER_SITE_OTHER): Payer: Managed Care, Other (non HMO) | Admitting: Family Medicine

## 2016-07-03 ENCOUNTER — Encounter: Payer: Self-pay | Admitting: Family Medicine

## 2016-07-03 ENCOUNTER — Other Ambulatory Visit: Payer: Self-pay

## 2016-07-03 VITALS — BP 100/64 | HR 61 | Temp 98.1°F | Resp 16 | Wt 176.0 lb

## 2016-07-03 DIAGNOSIS — R1010 Upper abdominal pain, unspecified: Secondary | ICD-10-CM

## 2016-07-03 DIAGNOSIS — R002 Palpitations: Secondary | ICD-10-CM

## 2016-07-03 DIAGNOSIS — Z299 Encounter for prophylactic measures, unspecified: Secondary | ICD-10-CM

## 2016-07-03 NOTE — Progress Notes (Signed)
Patient: Ariel Lawson Female    DOB: 1965/07/30   51 y.o.   MRN: [id number] Visit Date: 07/03/2016  Today's Provider: Lelon Huh, MD   Chief Complaint  Patient presents with  . Heartburn   Subjective:    Patient stated that she has been having occasional episodes after she eats where she has nausea, racing heart, and heartburn. These episodes are intense and last up to 30 minutes. Always in left upper quadrant and left side. Patient stated that this episodes started about 2 years ago. When episodes occur patient states that she has symptoms of bloating, nausea, heartburn and anxiety.  Seems to only occur with lung and dinner, but not every day. First episodes were about 2 years ago, but was very infrequent until the last months.   Is worse after eating. Has been on organic diet and started drinking alkanlized water (5-6 bottles) every day, which she starting drinking about a few month ago. Is also taking Aciphex daily.     Heartburn  She complains of belching, heartburn, a hoarse voice and nausea. She reports no abdominal pain, no chest pain, no choking, no coughing, no dysphagia, no early satiety, no globus sensation, no sore throat, no stridor, no tooth decay, no water brash or no wheezing. This is a recurrent problem. The current episode started more than 1 year ago. The problem occurs occasionally. The problem has been unchanged. The heartburn duration is several minutes (30 minutes). The heartburn is of moderate intensity. The heartburn does not wake her from sleep. The heartburn does not limit her activity. The heartburn doesn't change with position. Exacerbated by: eating. Pertinent negatives include no anemia, fatigue, melena, muscle weakness, orthopnea or weight loss. She has tried an antacid for the symptoms. The treatment provided mild relief.      Allergies  Allergen Reactions  . Prevacid  [Lansoprazole] Itching     Current Outpatient Prescriptions:  .   ALPRAZolam (XANAX) 0.25 MG tablet, As needed for panic attack, may take another dose in 30 minutes if needed., Disp: 6 tablet, Rfl: 0 .  Polysacchar Iron-FA-B12 (FERREX 150 FORTE) 150-1-25 MG-MG-MCG CAPS, Take 1 tablet by mouth 2 (two) times daily., Disp: 180 capsule, Rfl: 4 .  RABEprazole (ACIPHEX) 20 MG tablet, TAKE 1 TABLET BY MOUTH EVERY DAY, Disp: 30 tablet, Rfl: 9  Review of Systems  Constitutional: Negative for appetite change, fatigue and weight loss.  HENT: Positive for hoarse voice. Negative for sore throat.   Respiratory: Negative for cough, choking, chest tightness, shortness of breath and wheezing.   Cardiovascular: Negative for chest pain and palpitations.  Gastrointestinal: Positive for abdominal distention, heartburn and nausea. Negative for abdominal pain, dysphagia and melena.  Musculoskeletal: Negative for muscle weakness.  Neurological: Negative for weakness.    Social History  Substance Use Topics  . Smoking status: Never Smoker  . Smokeless tobacco: Never Used  . Alcohol use No   Objective:   BP 100/64 (BP Location: Right Arm, Patient Position: Sitting, Cuff Size: Large)   Pulse 61   Temp 98.1 F (36.7 C) (Oral)   Resp 16   Wt 176 lb (79.8 kg)   LMP 06/08/2016   SpO2 99%   BMI 30.21 kg/m  Vitals:   07/03/16 1028  BP: 100/64  Pulse: 61  Resp: 16  Temp: 98.1 F (36.7 C)  TempSrc: Oral  SpO2: 99%  Weight: 176 lb (79.8 kg)     Physical Exam  General Appearance:  Alert, cooperative, no distress  Eyes:    PERRL, conjunctiva/corneas clear, EOM's intact       Lungs:     Clear to auscultation bilaterally, respirations unlabored  Heart:    Regular rate and rhythm  Abdomen:   bowel sounds present and normal in all 4 quadrants. No CVA tenderness        Assessment & Plan:     1. Pain of upper abdomen Increasing in frequency over several months. She is drinking a large amount of alkalinized bottled water in addition to taking PPI which may be  affecting normal enzyme digestion. She was advised to drinking normal spring water or tap water. Will check labs. Consider GI referral if not improving and labs are normal.  - Comprehensive metabolic panel - CBC - H Pylori, IGM, IGG, IGA AB - Amylase - Celiac Disease Ab Screen w/Rfx  2. Palpitations Seem to occur during episodes of abdominal pain as discussed above. May be anxiety related to other symptoms. Consider 30 day event monitor as this is only occurring every few weeks.   The entirety of the information documented in the History of Present Illness, Review of Systems and Physical Exam were personally obtained by me. Portions of this information were initially documented by April M. Sabra Heck, CMA and reviewed by me for thoroughness and accuracy.        Lelon Huh, MD  [facility]

## 2016-07-03 NOTE — Patient Instructions (Signed)
   Stop drinking alkalinized water for the time being

## 2016-07-03 NOTE — Progress Notes (Signed)
Patient came in to have blood drawn for testing.

## 2016-07-05 LAB — CBC WITH DIFFERENTIAL/PLATELET
BASOS ABS: 0 10*3/uL (ref 0.0–0.2)
Basos: 0 %
EOS (ABSOLUTE): 0.1 10*3/uL (ref 0.0–0.4)
Eos: 1 %
Hematocrit: 38.8 % (ref 34.0–46.6)
Hemoglobin: 12.8 g/dL (ref 11.1–15.9)
IMMATURE GRANS (ABS): 0 10*3/uL (ref 0.0–0.1)
IMMATURE GRANULOCYTES: 0 %
LYMPHS: 27 %
Lymphocytes Absolute: 2.2 10*3/uL (ref 0.7–3.1)
MCH: 27.8 pg (ref 26.6–33.0)
MCHC: 33 g/dL (ref 31.5–35.7)
MCV: 84 fL (ref 79–97)
Monocytes Absolute: 0.4 10*3/uL (ref 0.1–0.9)
Monocytes: 5 %
NEUTROS PCT: 67 %
Neutrophils Absolute: 5.3 10*3/uL (ref 1.4–7.0)
PLATELETS: 310 10*3/uL (ref 150–379)
RBC: 4.6 x10E6/uL (ref 3.77–5.28)
RDW: 15.7 % — ABNORMAL HIGH (ref 12.3–15.4)
WBC: 8 10*3/uL (ref 3.4–10.8)

## 2016-07-05 LAB — COMPREHENSIVE METABOLIC PANEL
A/G RATIO: 1.4 (ref 1.2–2.2)
ALT: 8 IU/L (ref 0–32)
AST: 11 IU/L (ref 0–40)
Albumin: 4.2 g/dL (ref 3.5–5.5)
Alkaline Phosphatase: 63 IU/L (ref 39–117)
BUN/Creatinine Ratio: 14 (ref 9–23)
BUN: 11 mg/dL (ref 6–24)
CALCIUM: 9.3 mg/dL (ref 8.7–10.2)
CHLORIDE: 104 mmol/L (ref 96–106)
CO2: 22 mmol/L (ref 18–29)
Creatinine, Ser: 0.8 mg/dL (ref 0.57–1.00)
GFR calc Af Amer: 100 mL/min/{1.73_m2} (ref >59)
GFR, EST NON AFRICAN AMERICAN: 87 mL/min/{1.73_m2} (ref >59)
Globulin, Total: 3 g/dL (ref 1.5–4.5)
Glucose: 116 mg/dL — ABNORMAL HIGH (ref 65–99)
POTASSIUM: 4.1 mmol/L (ref 3.5–5.2)
Sodium: 139 mmol/L (ref 134–144)
Total Protein: 7.2 g/dL (ref 6.0–8.5)

## 2016-07-05 LAB — AMYLASE: AMYLASE: 64 U/L (ref 31–124)

## 2016-07-05 LAB — CELIAC DISEASE AB SCREEN W/RFX
Antigliadin Abs, IgA: <1 units (ref 0–19)
IgA/Immunoglobulin A, Serum: 176 mg/dL (ref 87–352)
Transglutaminase IgA: <2 U/mL (ref 0–3)

## 2016-07-05 LAB — H PYLORI, IGM, IGG, IGA AB

## 2016-09-06 ENCOUNTER — Other Ambulatory Visit: Payer: Self-pay | Admitting: Family Medicine

## 2016-09-07 NOTE — Telephone Encounter (Signed)
LOV 07/03/2016. Last refill 06/19/2015.

## 2016-12-03 ENCOUNTER — Other Ambulatory Visit: Payer: Self-pay | Admitting: Family Medicine

## 2016-12-04 NOTE — Telephone Encounter (Signed)
Pharmacy requesting refills. Thanks!  

## 2016-12-11 ENCOUNTER — Ambulatory Visit: Payer: Self-pay | Admitting: Physician Assistant

## 2016-12-11 ENCOUNTER — Encounter: Payer: Self-pay | Admitting: Physician Assistant

## 2016-12-11 VITALS — BP 139/75 | HR 102 | Temp 98.5°F | Resp 16

## 2016-12-11 DIAGNOSIS — J069 Acute upper respiratory infection, unspecified: Secondary | ICD-10-CM

## 2016-12-11 MED ORDER — METHYLPREDNISOLONE 4 MG PO TBPK: ORAL_TABLET | ORAL | 0 refills | Status: DC

## 2016-12-11 MED ORDER — AZITHROMYCIN 250 MG PO TABS
ORAL_TABLET | ORAL | 0 refills | Status: DC
Start: 2016-12-11 — End: 2017-02-27

## 2016-12-11 NOTE — Progress Notes (Signed)
S: C/o runny nose and congestion for 3 days, no fever, chills, cp/sob, v/d; cough is dry and hacking, noticed some wheezing at night. 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

## 2017-02-27 ENCOUNTER — Ambulatory Visit: Payer: Self-pay | Admitting: Registered Nurse

## 2017-02-27 VITALS — BP 120/80 | HR 94 | Temp 98.3°F | Resp 16

## 2017-02-27 DIAGNOSIS — H6983 Other specified disorders of Eustachian tube, bilateral: Secondary | ICD-10-CM

## 2017-02-27 DIAGNOSIS — J0101 Acute recurrent maxillary sinusitis: Secondary | ICD-10-CM

## 2017-02-27 DIAGNOSIS — A084 Viral intestinal infection, unspecified: Secondary | ICD-10-CM

## 2017-02-27 MED ORDER — ACETAMINOPHEN 160 MG PO CHEW: 975.0000 mg | CHEWABLE_TABLET | Freq: Three times a day (TID) | ORAL | 0 refills | Status: AC | PRN

## 2017-02-27 MED ORDER — SALINE SPRAY 0.65 % NA SOLN: 2.0000 | NASAL | 0 refills | Status: DC

## 2017-02-27 MED ORDER — AMOXICILLIN-POT CLAVULANATE 400-57 MG/5ML PO SUSR: 875.0000 mg | Freq: Two times a day (BID) | ORAL | 0 refills | Status: AC

## 2017-02-27 MED ORDER — FLUTICASONE PROPIONATE 50 MCG/ACT NA SUSP: 1.0000 | Freq: Two times a day (BID) | NASAL | 0 refills | Status: DC

## 2017-02-27 MED ORDER — IBUPROFEN 200 MG PO TABS: 600.0000 mg | ORAL_TABLET | Freq: Four times a day (QID) | ORAL | 0 refills | Status: AC | PRN

## 2017-02-27 NOTE — Progress Notes (Signed)
Subjective:     Patient ID: Ariel Lawson, female   DOB: 14-Jan-1966, 52 y.o.   MRN: [id number]  Married caucasian female established patient here for frontal headache, teeth pain/pressure upper, recent travel to [place] for holidays 27 Jan 2017 when symptoms initially started now worsening in her head.  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  Family members sick with similar symptoms yesterday nausea, headache, ear pain came on suddenly "felt like my head was going to explode at work yesterday"  Last night took tylenol and it relieved her headache but she doesn't like to take medications.  This am an advil feeling better but still pressure.  Has esophageal stricture prefers liquids for medications as pills get stuck.  Yesterday nausea also ate chicken noodle soup sipped for hours and finally nausea resolved.  Doesn't like nose sprays and doesn't want medrol dose pack as they make her feel bad and can't sleep.  Augmentin from [facility] last URI cleared up all symptoms and worked well for her.     Review of Systems  Constitutional: Positive for fatigue. Negative for activity change, appetite change, chills, diaphoresis, fever and unexpected weight change.  HENT: Positive for congestion, ear pain, postnasal drip, rhinorrhea, sinus pressure, sinus pain and sore throat. Negative for dental problem, drooling, ear discharge, facial swelling, hearing loss, mouth sores, nosebleeds, sneezing, tinnitus, trouble swallowing and voice change.   Eyes: Negative for photophobia, pain, discharge, redness, itching and visual disturbance.  Respiratory: Positive for cough. Negative for choking, chest tightness, shortness of breath, wheezing and stridor.   Cardiovascular: Negative for chest pain, palpitations and leg swelling.  Gastrointestinal: Positive for nausea. Negative for abdominal distention, abdominal pain, blood in stool, constipation, diarrhea and vomiting.  Endocrine: Negative for cold intolerance and heat  intolerance.  Genitourinary: Negative for difficulty urinating, dysuria and hematuria.  Musculoskeletal: Negative for arthralgias, back pain, gait problem, joint swelling, myalgias, neck pain and neck stiffness.  Skin: Negative for color change, pallor, rash and wound.  Allergic/Immunologic: Positive for environmental allergies. Negative for food allergies.  Neurological: Positive for headaches. Negative for dizziness, tremors, seizures, syncope, facial asymmetry, speech difficulty, weakness, light-headedness and numbness.  Hematological: Negative for adenopathy. Does not bruise/bleed easily.  Psychiatric/Behavioral: Negative for agitation, behavioral problems, confusion and sleep disturbance.       Objective:   Physical Exam  Constitutional: She is oriented to person, place, and time. Vital signs are normal. She appears well-developed and well-nourished. She is active and cooperative.  Non-toxic appearance. She does not have a sickly appearance. She appears ill. No distress.  HENT:  Head: Normocephalic and atraumatic.  Right Ear: Hearing, external ear and ear canal normal. A middle ear effusion is present.  Left Ear: Hearing, external ear and ear canal normal. A middle ear effusion is present.  Nose: Mucosal edema and rhinorrhea present. No nose lacerations, sinus tenderness, nasal deformity, septal deviation or nasal septal hematoma. No epistaxis.  No foreign bodies. Right sinus exhibits maxillary sinus tenderness and frontal sinus tenderness. Left sinus exhibits maxillary sinus tenderness and frontal sinus tenderness.  Mouth/Throat: Uvula is midline and mucous membranes are normal. Mucous membranes are not pale, not dry and not cyanotic. She does not have dentures. No oral lesions. No trismus in the jaw. Normal dentition. No dental abscesses, uvula swelling, lacerations or dental caries. Posterior oropharyngeal edema and posterior oropharyngeal erythema present. No oropharyngeal exudate or  tonsillar abscesses.  Maxillary greater than frontal tenderness bilaterally; cobblestoning posterior pharynx; tonsils enlarged cryptic 2+/4 bilaterally; bilateral  allergic shiners; bilateral TMs air fluid level clear; bilateral nasal turbinates scant clear discharge  Eyes: Conjunctivae, EOM and lids are normal. Pupils are equal, round, and reactive to light. Right eye exhibits no chemosis, no discharge, no exudate and no hordeolum. No foreign body present in the right eye. Left eye exhibits no chemosis, no discharge, no exudate and no hordeolum. No foreign body present in the left eye. Right conjunctiva is not injected. Right conjunctiva has no hemorrhage. Left conjunctiva is not injected. Left conjunctiva has no hemorrhage. No scleral icterus. Right eye exhibits normal extraocular motion and no nystagmus. Left eye exhibits normal extraocular motion and no nystagmus. Right pupil is round and reactive. Left pupil is round and reactive. Pupils are equal.  Neck: Trachea normal, normal range of motion and phonation normal. Neck supple. No tracheal tenderness and no muscular tenderness present. No neck rigidity. No tracheal deviation, no edema, no erythema and normal range of motion present. No thyroid mass and no thyromegaly present.  Cardiovascular: Normal rate, regular rhythm, S1 normal, S2 normal, normal heart sounds and intact distal pulses. PMI is not displaced. Exam reveals no gallop and no friction rub.  No murmur heard. Pulmonary/Chest: Effort normal and breath sounds normal. No accessory muscle usage or stridor. No respiratory distress. She has no decreased breath sounds. She has no wheezes. She has no rhonchi. She has no rales. She exhibits no tenderness.  No cough observed in exam room; spoke full sentences without difficulty  Abdominal: Soft. Normal appearance. She exhibits no shifting dullness, no distension, no pulsatile liver, no fluid wave, no abdominal bruit, no ascites, no pulsatile midline  mass and no mass. Bowel sounds are decreased. There is no hepatosplenomegaly. There is no tenderness. There is no rigidity, no rebound, no guarding, no CVA tenderness, no tenderness at McBurney's point and negative Murphy's sign. Hernia confirmed negative in the ventral area.  Dull to percussion x 4 quads; hypoactive bowel sounds x 4 quads  Musculoskeletal: Normal range of motion. She exhibits no edema or tenderness.       Right shoulder: Normal.       Left shoulder: Normal.       Right elbow: Normal.      Left elbow: Normal.       Right hip: Normal.       Left hip: Normal.       Right knee: Normal.       Left knee: Normal.       Cervical back: Normal.       Thoracic back: Normal.       Lumbar back: Normal.       Right hand: Normal.       Left hand: Normal.  Lymphadenopathy:       Head (right side): No submental, no submandibular, no tonsillar, no preauricular, no posterior auricular and no occipital adenopathy present.       Head (left side): No submental, no submandibular, no tonsillar, no preauricular, no posterior auricular and no occipital adenopathy present.    She has no cervical adenopathy.       Right cervical: No superficial cervical, no deep cervical and no posterior cervical adenopathy present.      Left cervical: No superficial cervical, no deep cervical and no posterior cervical adenopathy present.  Neurological: She is alert and oriented to person, place, and time. She has normal strength. She is not disoriented. She displays no atrophy and no tremor. No cranial nerve deficit or sensory deficit. She exhibits normal  muscle tone. She displays no seizure activity. Coordination and gait normal. GCS eye subscore is 4. GCS verbal subscore is 5. GCS motor subscore is 6.  On/off exam table without difficulty; gait sure and steady in hallway  Skin: Skin is warm, dry and intact. No abrasion, no bruising, no burn, no ecchymosis, no laceration, no lesion, no petechiae and no rash noted.  She is not diaphoretic. No cyanosis or erythema. No pallor. Nails show no clubbing.  Psychiatric: She has a normal mood and affect. Her speech is normal and behavior is normal. Judgment and thought content normal. Cognition and memory are normal.  Nursing note and vitals reviewed.      Assessment:     A-acute maxillary sinusitis recurrent, eustachian tube dysfunction, viral gastroenteritis    Plan:     P-recent international travel to [place] discussed bland diet, clear liquids refused antiemetic stated prefers ginger ale, soup broth.I have recommended clear fluids and the BRATT diet.  Medications as directed.  Avoid spicy/fried/large portions meat/dairy until symptoms resolve. Return to the clinic if symptoms persist or worsen; I have alerted the patient to call if high fever, dehydration, marked weakness, fainting, increased abdominal pain, blood in stool or vomit (red or black).   Exitcare handout on viral gastroenteritis given to patient. Patient verbalized agreement and understanding of treatment plan.   P2:  Hand washing and fitness   Electronic Rx start flonase 1 spray each nostril BID #1 RF0, saline 2 sprays each nostril q2h wa prn congestion.  If no improvement with 48 hours of saline and flonase use start augmentin liquid 73ml = 875mg  po BID x 10 days #220 RF0.  Electronic Rx given to her pharmacy of choice.  Tylenol 975mg  po q8h prn pain or advil 600mg  po q8h prn pain take with food.  24 hour work excuse given.  Denied personal or family history of ENT cancer.  Shower BID especially prior to bed. No evidence of systemic bacterial infection, non toxic and well hydrated.  I do not see where any further testing or imaging is necessary at this time.   I will suggest supportive care, rest, good hygiene and encourage the patient to take adequate fluids.  The patient is to return to clinic or EMERGENCY ROOM if symptoms worsen or change significantly.  Exitcare handout on sinusitis and sinus rinse  given to patient.  Patient verbalized agreement and understanding of treatment plan and had no further questions at this time.    Supportive treatment.   No evidence of invasive bacterial infection, non toxic and well hydrated.  This is most likely self limiting viral infection.  I do not see where any further testing or imaging is necessary at this time.   I will suggest supportive care, rest, good hygiene and encourage the patient to take adequate fluids.  The patient is to return to clinic or EMERGENCY ROOM if symptoms worsen or change significantly e.g. ear pain, fever, purulent discharge from ears or bleeding.  Exitcare handout on eustachian tube dysfunction given to patient.  Patient verbalized agreement and understanding of treatment plan.   P2:  Hand washing and cover cough

## 2017-02-27 NOTE — Patient Instructions (Addendum)
Viral Gastroenteritis, Adult Viral gastroenteritis is also known as the stomach flu. This condition is caused by various viruses. These viruses can be passed from person to person very easily (are very contagious). This condition may affect your stomach, small intestine, and large intestine. It can cause sudden watery diarrhea, fever, and vomiting. Diarrhea and vomiting can make you feel weak and cause you to become dehydrated. You may not be able to keep fluids down. Dehydration can make you tired and thirsty, cause you to have a dry mouth, and decrease how often you urinate. Older adults and people with other diseases or a weak immune system are at higher risk for dehydration. It is important to replace the fluids that you lose from diarrhea and vomiting. If you become severely dehydrated, you may need to get fluids through an IV tube. What are the causes? Gastroenteritis is caused by various viruses, including rotavirus and norovirus. Norovirus is the most common cause in adults. You can get sick by eating food, drinking water, or touching a surface contaminated with one of these viruses. You can also get sick from sharing utensils or other personal items with an infected person. What increases the risk? This condition is more likely to develop in people:  Who have a weak defense system (immune system).  Who live with one or more children who are younger than 2 years old.  Who live in a nursing home.  Who go on cruise ships.  What are the signs or symptoms? Symptoms of this condition start suddenly 1-2 days after exposure to a virus. Symptoms may last a few days or as long as a week. The most common symptoms are watery diarrhea and vomiting. Other symptoms include:  Fever.  Headache.  Fatigue.  Pain in the abdomen.  Chills.  Weakness.  Nausea.  Muscle aches.  Loss of appetite.  How is this diagnosed? This condition is diagnosed with a medical history and physical exam. You  may also have a stool test to check for viruses or other infections. How is this treated? This condition typically goes away on its own. The focus of treatment is to restore lost fluids (rehydration). Your health care provider may recommend that you take an oral rehydration solution (ORS) to replace important salts and minerals (electrolytes) in your body. Severe cases of this condition may require giving fluids through an IV tube. Treatment may also include medicine to help with your symptoms. Follow these instructions at home: Follow instructions from your health care provider about how to care for yourself at home. Eating and drinking Follow these recommendations as told by your health care provider:  Take an ORS. This is a drink that is sold at pharmacies and retail stores.  Drink clear fluids in small amounts as you are able. Clear fluids include water, ice chips, diluted fruit juice, and low-calorie sports drinks.  Eat bland, easy-to-digest foods in small amounts as you are able. These foods include bananas, applesauce, rice, lean meats, toast, and crackers.  Avoid fluids that contain a lot of sugar or caffeine, such as energy drinks, sports drinks, and soda.  Avoid alcohol.  Avoid spicy or fatty foods.  General instructions   Drink enough fluid to keep your urine clear or pale yellow.  Wash your hands often. If soap and water are not available, use hand sanitizer.  Make sure that all people in your household wash their hands well and often.  Take over-the-counter and prescription medicines only as told by your health   care provider.  Rest at home while you recover.  Watch your condition for any changes.  Take a warm bath to relieve any burning or pain from frequent diarrhea episodes.  Keep all follow-up visits as told by your health care provider. This is important. Contact a health care provider if:  You cannot keep fluids down.  Your symptoms get worse.  You have  new symptoms.  You feel light-headed or dizzy.  You have muscle cramps. Get help right away if:  You have chest pain.  You feel extremely weak or you faint.  You see blood in your vomit.  Your vomit looks like coffee grounds.  You have bloody or black stools or stools that look like tar.  You have a severe headache, a stiff neck, or both.  You have a rash.  You have severe pain, cramping, or bloating in your abdomen.  You have trouble breathing or you are breathing very quickly.  Your heart is beating very quickly.  Your skin feels cold and clammy.  You feel confused.  You have pain when you urinate.  You have signs of dehydration, such as: ? Dark urine, very little urine, or no urine. ? Cracked lips. ? Dry mouth. ? Sunken eyes. ? Sleepiness. ? Weakness. This information is not intended to replace advice given to you by your health care provider. Make sure you discuss any questions you have with your health care provider. Document Released: 01/22/2005 Document Revised: 07/06/2015 Document Reviewed: 09/28/2014 Elsevier Interactive Patient Education  2018 [place]. Eustachian Tube Dysfunction The eustachian tube connects the middle ear to the back of the nose. It regulates air pressure in the middle ear by allowing air to move between the ear and nose. It also helps to drain fluid from the middle ear space. When the eustachian tube does not function properly, air pressure, fluid, or both can build up in the middle ear. Eustachian tube dysfunction can affect one or both ears. What are the causes? This condition happens when the eustachian tube becomes blocked or cannot open normally. This may result from:  Ear infections.  Colds and other upper respiratory infections.  Allergies.  Irritation, such as from cigarette smoke or acid from the stomach coming up into the esophagus (gastroesophageal reflux).  Sudden changes in air pressure, such as from descending  in an airplane.  Abnormal growths in the nose or throat, such as nasal polyps, tumors, or enlarged tissue at the back of the throat (adenoids).  What increases the risk? This condition may be more likely to develop in people who smoke and people who are overweight. Eustachian tube dysfunction may also be more likely to develop in children, especially children who have:  Certain birth defects of the mouth, such as cleft palate.  Large tonsils and adenoids.  What are the signs or symptoms? Symptoms of this condition may include:  A feeling of fullness in the ear.  Ear pain.  Clicking or popping noises in the ear.  Ringing in the ear.  Hearing loss.  Loss of balance.  Symptoms may get worse when the air pressure around you changes, such as when you travel to an area of high elevation or fly on an airplane. How is this diagnosed? This condition may be diagnosed based on:  Your symptoms.  A physical exam of your ear, nose, and throat.  Tests, such as those that measure: ? The movement of your eardrum (tympanogram). ? Your hearing (audiometry).  How is this treated?  Treatment depends on the cause and severity of your condition. If your symptoms are mild, you may be able to relieve your symptoms by moving air into ("popping") your ears. If you have symptoms of fluid in your ears, treatment may include:  Decongestants.  Antihistamines.  Nasal sprays or ear drops that contain medicines that reduce swelling (steroids).  In some cases, you may need to have a procedure to drain the fluid in your eardrum (myringotomy). In this procedure, a small tube is placed in the eardrum to:  Drain the fluid.  Restore the air in the middle ear space.  Follow these instructions at home:  Take over-the-counter and prescription medicines only as told by your health care provider.  Use techniques to help pop your ears as recommended by your health care provider. These may  include: ? Chewing gum. ? Yawning. ? Frequent, forceful swallowing. ? Closing your mouth, holding your nose closed, and gently blowing as if you are trying to blow air out of your nose.  Do not do any of the following until your health care provider approves: ? Travel to high altitudes. ? Fly in airplanes. ? Work in a [employer] or room. ? Scuba dive.  Keep your ears dry. Dry your ears completely after showering or bathing.  Do not smoke.  Keep all follow-up visits as told by your health care provider. This is important. Contact a health care provider if:  Your symptoms do not go away after treatment.  Your symptoms come back after treatment.  You are unable to pop your ears.  You have: ? A fever. ? Pain in your ear. ? Pain in your head or neck. ? Fluid draining from your ear.  Your hearing suddenly changes.  You become very dizzy.  You lose your balance. This information is not intended to replace advice given to you by your health care provider. Make sure you discuss any questions you have with your health care provider. Document Released: 02/18/2015 Document Revised: 06/30/2015 Document Reviewed: 02/10/2014 Elsevier Interactive Patient Education  2018 [employer]. Sinusitis, Adult Sinusitis is soreness and inflammation of your sinuses. Sinuses are hollow spaces in the bones around your face. Your sinuses are located:  Around your eyes.  In the middle of your forehead.  Behind your nose.  In your cheekbones.  Your sinuses and nasal passages are lined with a stringy fluid (mucus). Mucus normally drains out of your sinuses. When your nasal tissues become inflamed or swollen, the mucus can become trapped or blocked so air cannot flow through your sinuses. This allows bacteria, viruses, and funguses to grow, which leads to infection. Sinusitis can develop quickly and last for 7?10 days (acute) or for more than 12 weeks (chronic). Sinusitis often develops  after a cold. What are the causes? This condition is caused by anything that creates swelling in the sinuses or stops mucus from draining, including:  Allergies.  Asthma.  Bacterial or viral infection.  Abnormally shaped bones between the nasal passages.  Nasal growths that contain mucus (nasal polyps).  Narrow sinus openings.  Pollutants, such as chemicals or irritants in the air.  A foreign object stuck in the nose.  A fungal infection. This is rare.  What increases the risk? The following factors may make you more likely to develop this condition:  Having allergies or asthma.  Having had a recent cold or respiratory tract infection.  Having structural deformities or blockages in your nose or sinuses.  Having a weak immune  system.  Doing a lot of swimming or diving.  Overusing nasal sprays.  Smoking.  What are the signs or symptoms? The main symptoms of this condition are pain and a feeling of pressure around the affected sinuses. Other symptoms include:  Upper toothache.  Earache.  Headache.  Bad breath.  Decreased sense of smell and taste.  A cough that may get worse at night.  Fatigue.  Fever.  Thick drainage from your nose. The drainage is often green and it may contain pus (purulent).  Stuffy nose or congestion.  Postnasal drip. This is when extra mucus collects in the throat or back of the nose.  Swelling and warmth over the affected sinuses.  Sore throat.  Sensitivity to light.  How is this diagnosed? This condition is diagnosed based on symptoms, a medical history, and a physical exam. To find out if your condition is acute or chronic, your health care provider may:  Look in your nose for signs of nasal polyps.  Tap over the affected sinus to check for signs of infection.  View the inside of your sinuses using an imaging device that has a light attached (endoscope).  If your health care provider suspects that you have chronic  sinusitis, you may also:  Be tested for allergies.  Have a sample of mucus taken from your nose (nasal culture) and checked for bacteria.  Have a mucus sample examined to see if your sinusitis is related to an allergy.  If your sinusitis does not respond to treatment and it lasts longer than 8 weeks, you may have an MRI or CT scan to check your sinuses. These scans also help to determine how severe your infection is. In rare cases, a bone biopsy may be done to rule out more serious types of fungal sinus disease. How is this treated? Treatment for sinusitis depends on the cause and whether your condition is chronic or acute. If a virus is causing your sinusitis, your symptoms will go away on their own within 10 days. You may be given medicines to relieve your symptoms, including:  Topical nasal decongestants. They shrink swollen nasal passages and let mucus drain from your sinuses.  Antihistamines. These drugs block inflammation that is triggered by allergies. This can help to ease swelling in your nose and sinuses.  Topical nasal corticosteroids. These are nasal sprays that ease inflammation and swelling in your nose and sinuses.  Nasal saline washes. These rinses can help to get rid of thick mucus in your nose.  If your condition is caused by bacteria, you will be given an antibiotic medicine. If your condition is caused by a fungus, you will be given an antifungal medicine. Surgery may be needed to correct underlying conditions, such as narrow nasal passages. Surgery may also be needed to remove polyps. Follow these instructions at home: Medicines  Take, use, or apply over-the-counter and prescription medicines only as told by your health care provider. These may include nasal sprays.  If you were prescribed an antibiotic medicine, take it as told by your health care provider. Do not stop taking the antibiotic even if you start to feel better. Hydrate and Humidify  Drink enough water  to keep your urine clear or pale yellow. Staying hydrated will help to thin your mucus.  Use a cool mist humidifier to keep the humidity level in your home above 50%.  Inhale steam for 10-15 minutes, 3-4 times a day or as told by your health care provider. You can do  this in the bathroom while a hot shower is running.  Limit your exposure to cool or dry air. Rest  Rest as much as possible.  Sleep with your head raised (elevated).  Make sure to get enough sleep each night. General instructions  Apply a warm, moist washcloth to your face 3-4 times a day or as told by your health care provider. This will help with discomfort.  Wash your hands often with soap and water to reduce your exposure to viruses and other germs. If soap and water are not available, use hand sanitizer.  Do not smoke. Avoid being around people who are smoking (secondhand smoke).  Keep all follow-up visits as told by your health care provider. This is important. Contact a health care provider if:  You have a fever.  Your symptoms get worse.  Your symptoms do not improve within 10 days. Get help right away if:  You have a severe headache.  You have persistent vomiting.  You have pain or swelling around your face or eyes.  You have vision problems.  You develop confusion.  Your neck is stiff.  You have trouble breathing. This information is not intended to replace advice given to you by your health care provider. Make sure you discuss any questions you have with your health care provider. Document Released: 01/22/2005 Document Revised: 09/18/2015 Document Reviewed: 11/17/2014 Elsevier Interactive Patient Education  2018 [place]. Sinus Rinse What is a sinus rinse? A sinus rinse is a simple home treatment that is used to rinse your sinuses with a sterile mixture of salt and water (saline solution). Sinuses are air-filled spaces in your skull behind the bones of your face and forehead that open  into your nasal cavity. You will use the following:  Saline solution.  Neti pot or spray bottle. This releases the saline solution into your nose and through your sinuses. Neti pots and spray bottles can be purchased at [employer], a health food store, or online.  When would I do a sinus rinse? A sinus rinse can help to clear mucus, dirt, dust, or pollen from the nasal cavity. You may do a sinus rinse when you have a cold, a virus, nasal allergy symptoms, a sinus infection, or stuffiness in the nose or sinuses. If you are considering a sinus rinse:  Ask your child's health care provider before performing a sinus rinse on your child.  Do not do a sinus rinse if you have had ear or nasal surgery, ear infection, or blocked ears.  How do I do a sinus rinse?  Wash your hands.  Disinfect your device according to the directions provided and then dry it.  Use the solution that comes with your device or one that is sold separately in stores. Follow the mixing directions on the package.  Fill your device with the amount of saline solution as directed by the device instructions.  Stand over a sink and tilt your head sideways over the sink.  Place the spout of the device in your upper nostril (the one closer to the ceiling).  Gently pour or squeeze the saline solution into the nasal cavity. The liquid should drain to the lower nostril if you are not overly congested.  Gently blow your nose. Blowing too hard may cause ear pain.  Repeat in the other nostril.  Clean and rinse your device with clean water and then air-dry it. Are there risks of a sinus rinse? Sinus rinse is generally very safe and effective.  However, there are a few risks, which include:  A burning sensation in the sinuses. This may happen if you do not make the saline solution as directed. Make sure to follow all directions when making the saline solution.  Infection from contaminated water. This is rare, but  possible.  Nasal irritation.  This information is not intended to replace advice given to you by your health care provider. Make sure you discuss any questions you have with your health care provider. Document Released: 08/19/2013 Document Revised: 12/20/2015 Document Reviewed: 06/09/2013 Elsevier Interactive Patient Education  2017 [employer].

## 2017-03-14 ENCOUNTER — Telehealth: Payer: Self-pay | Admitting: Registered Nurse

## 2017-03-14 ENCOUNTER — Encounter: Payer: Self-pay | Admitting: Registered Nurse

## 2017-03-14 MED ORDER — FLUTICASONE PROPIONATE 50 MCG/ACT NA SUSP: 1.0000 | Freq: Two times a day (BID) | NASAL | 0 refills | Status: DC

## 2017-03-14 NOTE — Telephone Encounter (Signed)
Received fax from [facility] patient requesting refill of flonase nasal spray 1 spray each nostril BID.  Refilled for patient.  Attempted to notify patient but voicemail not set up and no answer at [phone]

## 2017-04-04 ENCOUNTER — Telehealth: Payer: Self-pay

## 2017-04-04 ENCOUNTER — Other Ambulatory Visit: Payer: Self-pay

## 2017-04-04 DIAGNOSIS — J0101 Acute recurrent maxillary sinusitis: Secondary | ICD-10-CM

## 2017-04-04 MED ORDER — FLUTICASONE PROPIONATE 50 MCG/ACT NA SUSP: 1.0000 | Freq: Two times a day (BID) | NASAL | 0 refills | Status: DC

## 2017-04-04 NOTE — Telephone Encounter (Signed)
Received fax for refill of Fluticasone from pharmacy. Refilled with note that all future refills be done by primary care provider per request from [facility].

## 2017-06-10 ENCOUNTER — Ambulatory Visit (INDEPENDENT_AMBULATORY_CARE_PROVIDER_SITE_OTHER): Payer: Managed Care, Other (non HMO) | Admitting: Family Medicine

## 2017-06-10 ENCOUNTER — Encounter: Payer: Self-pay | Admitting: Family Medicine

## 2017-06-10 VITALS — BP 98/58 | HR 81 | Temp 98.3°F | Resp 16 | Ht 64.0 in | Wt 182.0 lb

## 2017-06-10 DIAGNOSIS — Z809 Family history of malignant neoplasm, unspecified: Secondary | ICD-10-CM

## 2017-06-10 DIAGNOSIS — D500 Iron deficiency anemia secondary to blood loss (chronic): Secondary | ICD-10-CM

## 2017-06-10 DIAGNOSIS — Z23 Encounter for immunization: Secondary | ICD-10-CM

## 2017-06-10 DIAGNOSIS — R42 Dizziness and giddiness: Secondary | ICD-10-CM | POA: Diagnosis not present

## 2017-06-10 DIAGNOSIS — Z0001 Encounter for general adult medical examination with abnormal findings: Secondary | ICD-10-CM

## 2017-06-10 DIAGNOSIS — Z1211 Encounter for screening for malignant neoplasm of colon: Secondary | ICD-10-CM

## 2017-06-10 DIAGNOSIS — R300 Dysuria: Secondary | ICD-10-CM | POA: Diagnosis not present

## 2017-06-10 DIAGNOSIS — Z0000 Encounter for general adult medical examination without abnormal findings: Secondary | ICD-10-CM

## 2017-06-10 DIAGNOSIS — R002 Palpitations: Secondary | ICD-10-CM

## 2017-06-10 LAB — POCT URINALYSIS DIPSTICK
Appearance: NORMAL
BILIRUBIN UA: NEGATIVE
Blood, UA: NEGATIVE
GLUCOSE UA: NEGATIVE
KETONES UA: NEGATIVE
LEUKOCYTES UA: NEGATIVE
Nitrite, UA: NEGATIVE
Odor: NORMAL
Urobilinogen, UA: 0.2 E.U./dL
pH, UA: 6 (ref 5.0–8.0)

## 2017-06-10 NOTE — Progress Notes (Signed)
Patient: Ariel Lawson, Female    DOB: 09/30/1965, 52 y.o.   MRN: [id number] Visit Date: 06/10/2017  Today's Provider: Lelon Huh, MD   Chief Complaint  Patient presents with  . Annual Exam   Subjective:    Annual physical exam Ariel Lawson is a 52 y.o. female who presents today for health maintenance and complete physical. She feels well. She reports exercising none. She reports she is sleeping fairly well. ----------------------------------------------------------------  Anemia, unspecified type From 04/23/2016-no changes. Continues oral iron replacement daily which she is tolerating well.   She also reports that she has been having irregular heart beats nearly every time she heats. She feels a little dizzy during episodes. They can last from a few minutes to half any hour. Occurs most days. Has actually avoided eating due to concern about irregular heartbeat. No pain associated with symptoms.   She also reports extensive family history of cancer including melanoma, lung cancer, breast cancer and uterine or ovarian cancer. She request additional testing just to make sure she doesn't have cancer. She states she has been having heavy periods for several months and have been less regular, but has not had any spotting or bleeding between periods. She does have some cramping and bloating worse right pelvic area.       Review of Systems  Constitutional: Negative for chills, diaphoresis and fever.  HENT: Negative for congestion, ear discharge, ear pain, hearing loss, nosebleeds, sore throat and tinnitus.   Eyes: Negative for photophobia, pain, discharge and redness.  Respiratory: Negative for cough, shortness of breath, wheezing and stridor.   Cardiovascular: Positive for palpitations. Negative for chest pain and leg swelling.  Gastrointestinal: Positive for abdominal pain. Negative for blood in stool, constipation, diarrhea, nausea and vomiting.  Endocrine: Negative for  polydipsia.  Genitourinary: Negative for dysuria, flank pain, frequency, hematuria and urgency.  Musculoskeletal: Negative for back pain, myalgias and neck pain.  Skin: Negative for rash.  Allergic/Immunologic: Negative for environmental allergies.  Neurological: Negative for dizziness, tremors, seizures, weakness and headaches.  Hematological: Does not bruise/bleed easily.  Psychiatric/Behavioral: Negative for hallucinations and suicidal ideas. The patient is not nervous/anxious.   All other systems reviewed and are negative.   Social History      She  reports that she has never smoked. She has never used smokeless tobacco. She reports that she does not drink alcohol or use drugs.       Social History   Socioeconomic History  . Marital status: Married    Spouse name: Not on file  . Number of children: 4  . Years of education: Not on file  . Highest education level: Not on file  Occupational History  . Not on file  Social Needs  . Financial resource strain: Not on file  . Food insecurity:    Worry: Not on file    Inability: Not on file  . Transportation needs:    Medical: Not on file    Non-medical: Not on file  Tobacco Use  . Smoking status: Never Smoker  . Smokeless tobacco: Never Used  Substance and Sexual Activity  . Alcohol use: No    Alcohol/week: 0.0 oz  . Drug use: No  . Sexual activity: Not on file  Lifestyle  . Physical activity:    Days per week: Not on file    Minutes per session: Not on file  . Stress: Not on file  Relationships  . Social connections:    Talks  on phone: Not on file    Gets together: Not on file    Attends religious service: Not on file    Active member of club or organization: Not on file    Attends meetings of clubs or organizations: Not on file    Relationship status: Not on file  Other Topics Concern  . Not on file  Social History Narrative  . Not on file    Past Medical History:  Diagnosis Date  . Anemia    IDA  .  Fibroids 2015   uterus- cyst  . Herpes simplex   . History of chicken pox   . Ovarian cyst    right     Patient Active Problem List   Diagnosis Date Noted  . Panic attack 07/13/2015  . PAC (premature atrial contraction) 07/12/2015  . Greater trochanteric bursitis of right hip 02/23/2015  . Dysphagia 11/12/2014  . Obesity 11/12/2014  . Palpitations 11/12/2014  . Reflux esophagitis 11/12/2014  . Back muscle spasm 11/12/2014  . Edema 08/20/2006  . Anemia, iron deficiency 08/07/2006  . 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 GERD (gastroesophageal reflux disease) 02/05/2002  . Uncomplicated herpes simplex 02/05/2001    Past Surgical History:  Procedure Laterality Date  . LEEP  2007   Leep cone Biopsy of Cervix  . screning mammogram  09/2012   ordered through [place] employer per pt report    Family History        Family Status  Relation Name Status  . Mother  Alive       Myxedema  . Father  Deceased       lung cancer  . Sister  Alive  . Sister  Alive        Her family history includes Anemia in her mother; Diabetes in her mother; Hyperlipidemia in her mother; Hypertension in her mother; Hypothyroidism in her sister and sister; Lung cancer in her father; Melanoma in her sister; Thyroid cancer in her sister.      Allergies  Allergen Reactions  . Prevacid  [Lansoprazole] Itching     Current Outpatient Medications:  .  POLY-IRON 150 FORTE 150-25-1 MG-MCG-MG CAPS, Take 1 capsule by mouth 2 (two) times daily., Disp: , Rfl: 3 .  RABEprazole (ACIPHEX) 20 MG tablet, TAKE 1 TABLET BY MOUTH EVERY DAY, Disp: 30 tablet, Rfl: 12 .  fluticasone (FLONASE) 50 MCG/ACT nasal spray, Place 1 spray into both nostrils 2 (two) times daily., Disp: 16 g, Rfl: 0 .  sodium chloride (OCEAN) 0.65 % SOLN nasal spray, Place 2 sprays into both nostrils every 2 (two) hours while awake., Disp: , Rfl: 0   Patient Care Team: Birdie Sons, MD as PCP - General (Family Medicine) Brendolyn Patty, MD (Dermatology) Tiajuana Amass, MD as Referring Physician (Allergy and Immunology)      Objective:   Vitals: BP (!) 98/58 (BP Location: Right Arm, Patient Position: Sitting, Cuff Size: Large)   Pulse 81   Temp 98.3 F (36.8 C) (Oral)   Resp 16   Ht 5\' 4"  (1.626 m)   Wt 182 lb (82.6 kg)   SpO2 98%   BMI 31.24 kg/m    Vitals:   06/10/17 1513  BP: (!) 98/58  Pulse: 81  Resp: 16  Temp: 98.3 F (36.8 C)  TempSrc: Oral  SpO2: 98%  Weight: 182 lb (82.6 kg)  Height: 5\' 4"  (1.626 m)     Physical Exam   General Appearance:  Alert, cooperative, no distress, appears stated age, overweight  Head:    Normocephalic, without obvious abnormality, atraumatic  Eyes:    PERRL, conjunctiva/corneas clear, EOM's intact, fundi    benign, both eyes  Ears:    Normal TM's and external ear canals, both ears  Nose:   Nares normal, septum midline, mucosa normal, no drainage    or sinus tenderness  Throat:   Lips, mucosa, and tongue normal; teeth and gums normal  Neck:   Supple, symmetrical, trachea midline, no adenopathy;    thyroid:  no enlargement/tenderness/nodules; no carotid   bruit or JVD  Back:     Symmetric, no curvature, ROM normal, no CVA tenderness  Lungs:     Clear to auscultation bilaterally, respirations unlabored  Chest Wall:    No tenderness or deformity   Heart:    Regular rate and rhythm, S1 and S2 normal, no murmur, rub   or gallop  Breast Exam:    normal appearance, no masses or tenderness, deferred  Abdomen:     Soft, non-tender, bowel sounds active all four quadrants,    no masses, no organomegaly  Pelvic:    deferred  Extremities:   Extremities normal, atraumatic, no cyanosis or edema  Pulses:   2+ and symmetric all extremities  Skin:   Skin color, texture, turgor normal, no rashes or lesions  Lymph nodes:   Cervical, supraclavicular, and axillary nodes normal  Neurologic:   CNII-XII intact, normal strength, sensation and reflexes    throughout     Depression Screen PHQ 2/9 Scores 04/23/2016  PHQ - 2 Score 1  PHQ- 9 Score 4    Results for orders placed or performed in visit on 06/10/17  POCT urinalysis dipstick  Result Value Ref Range   Color, UA Yellow    Clarity, UA Clear    Glucose, UA Neg    Bilirubin, UA Neg    Ketones, UA Neg    Spec Grav, UA >=1.030 (A) 1.010 - 1.025   Blood, UA Neg    pH, UA 6.0 5.0 - 8.0   Protein, UA Trace    Urobilinogen, UA 0.2 0.2 or 1.0 E.U./dL   Nitrite, UA Neg    Leukocytes, UA Negative Negative   Appearance Normal    Odor Normal      Assessment & Plan:     Routine Health Maintenance and Physical Exam  Exercise Activities and Dietary recommendations Goals    None       There is no immunization history on file for this patient.  Health Maintenance  Topic Date Due  . TETANUS/TDAP  12/15/1985  . MAMMOGRAM  12/15/2016  . COLONOSCOPY  12/15/2016  . PAP SMEAR  04/24/2019  . HIV Screening  Completed     Discussed health benefits of physical activity, and encouraged her to engage in regular exercise appropriate for her age and condition.    --------------------------------------------------------------------  1. Annual physical exam Mildly obese, . Work on diet and exercise to lose weight, otherwise normal exam.  - CBC - Comprehensive metabolic panel - Lipid panel - VITAMIN D 25 Hydroxy (Vit-D Deficiency, Fractures)  2. Need for prophylactic vaccination using tetanus and diphtheria toxoids adsorbed (Td) vaccine Recommended Tdap vaccine which she refused.   3. Colon cancer screening  - Ambulatory referral to Gastroenterology  4. Family history of cancer She is very concerned that she needs additional cancer screening due to extensive family history of cancer. Will set her up in genetics clinic to see  if she qualifies for other screenings.  - Ambulatory referral to Genetics  5. Dysuria  - POCT urinalysis dipstick  6. Palpitations  - Holter monitor - 48  hour; Future - TSH - Magnesium - T4, free - VITAMIN D 25 Hydroxy (Vit-D Deficiency, Fractures)  7. Dizziness   8. Iron deficiency anemia due to chronic blood loss  - CBC - Ferritin    Lelon Huh, MD  [facility]

## 2017-06-10 NOTE — Patient Instructions (Addendum)
   The CDC recommends two doses of Shingrix (the shingles vaccine) separated by 2 to 6 months for adults age 51 years and older. I recommend checking with your insurance plan regarding coverage for this vaccine.     Please call the [facility] [phone]) to schedule a routine screening mammogram.

## 2017-06-13 ENCOUNTER — Other Ambulatory Visit: Payer: Self-pay

## 2017-06-13 DIAGNOSIS — Z0000 Encounter for general adult medical examination without abnormal findings: Secondary | ICD-10-CM

## 2017-06-14 ENCOUNTER — Ambulatory Visit
Admission: RE | Admit: 2017-06-14 | Discharge: 2017-06-14 | Disposition: A | Discharge status: Home / Self Care | Payer: Managed Care, Other (non HMO) | Source: Ambulatory Visit | Attending: Family Medicine | Admitting: Family Medicine

## 2017-06-14 DIAGNOSIS — R002 Palpitations: Secondary | ICD-10-CM

## 2017-06-14 LAB — COMPREHENSIVE METABOLIC PANEL
ALBUMIN: 3.9 g/dL (ref 3.5–5.5)
ALT: 14 IU/L (ref 0–32)
AST: 19 IU/L (ref 0–40)
Albumin/Globulin Ratio: 1.6 (ref 1.2–2.2)
Alkaline Phosphatase: 65 IU/L (ref 39–117)
BUN / CREAT RATIO: 12 (ref 9–23)
BUN: 9 mg/dL (ref 6–24)
Bilirubin Total: 0.3 mg/dL (ref 0.0–1.2)
CO2: 22 mmol/L (ref 20–29)
CREATININE: 0.73 mg/dL (ref 0.57–1.00)
Calcium: 8.5 mg/dL — ABNORMAL LOW (ref 8.7–10.2)
Chloride: 106 mmol/L (ref 96–106)
GFR calc Af Amer: 111 mL/min/{1.73_m2} (ref >59)
GFR calc non Af Amer: 96 mL/min/{1.73_m2} (ref >59)
GLOBULIN, TOTAL: 2.5 g/dL (ref 1.5–4.5)
GLUCOSE: 83 mg/dL (ref 65–99)
Potassium: 4.2 mmol/L (ref 3.5–5.2)
SODIUM: 141 mmol/L (ref 134–144)
TOTAL PROTEIN: 6.4 g/dL (ref 6.0–8.5)

## 2017-06-14 LAB — CBC WITH DIFFERENTIAL/PLATELET
BASOS ABS: 0 10*3/uL (ref 0.0–0.2)
Basos: 1 %
EOS (ABSOLUTE): 0.2 10*3/uL (ref 0.0–0.4)
Eos: 3 %
HEMATOCRIT: 34.9 % (ref 34.0–46.6)
Hemoglobin: 11.4 g/dL (ref 11.1–15.9)
IMMATURE GRANS (ABS): 0 10*3/uL (ref 0.0–0.1)
Immature Granulocytes: 0 %
LYMPHS ABS: 1.8 10*3/uL (ref 0.7–3.1)
LYMPHS: 32 %
MCH: 28.4 pg (ref 26.6–33.0)
MCHC: 32.7 g/dL (ref 31.5–35.7)
MCV: 87 fL (ref 79–97)
MONOCYTES: 8 %
Monocytes Absolute: 0.4 10*3/uL (ref 0.1–0.9)
NEUTROS ABS: 3.1 10*3/uL (ref 1.4–7.0)
Neutrophils: 56 %
Platelets: 270 10*3/uL (ref 150–379)
RBC: 4.02 x10E6/uL (ref 3.77–5.28)
RDW: 15.4 % (ref 12.3–15.4)
WBC: 5.5 10*3/uL (ref 3.4–10.8)

## 2017-06-14 LAB — TSH: TSH: 2.01 u[IU]/mL (ref 0.450–4.500)

## 2017-06-14 LAB — T4, FREE: Free T4: 1.26 ng/dL (ref 0.82–1.77)

## 2017-06-14 LAB — LIPID PANEL
CHOL/HDL RATIO: 3.4 ratio (ref 0.0–4.4)
Cholesterol, Total: 155 mg/dL (ref 100–199)
HDL: 46 mg/dL (ref >39)
LDL CALC: 98 mg/dL (ref 0–99)
Triglycerides: 55 mg/dL (ref 0–149)
VLDL CHOLESTEROL CAL: 11 mg/dL (ref 5–40)

## 2017-06-14 LAB — MAGNESIUM: MAGNESIUM: 2 mg/dL (ref 1.6–2.3)

## 2017-06-14 LAB — FERRITIN: FERRITIN: 13 ng/mL — AB (ref 15–150)

## 2017-06-14 LAB — VITAMIN D 25 HYDROXY (VIT D DEFICIENCY, FRACTURES): VIT D 25 HYDROXY: 27.1 ng/mL — AB (ref 30.0–100.0)

## 2017-06-19 ENCOUNTER — Telehealth: Payer: Self-pay | Admitting: Family Medicine

## 2017-06-19 DIAGNOSIS — R002 Palpitations: Secondary | ICD-10-CM

## 2017-06-19 DIAGNOSIS — R42 Dizziness and giddiness: Secondary | ICD-10-CM

## 2017-06-19 NOTE — Telephone Encounter (Signed)
Pt wore a Holter monitor, Placed on the 10 th and brought back on the 12 th.to cardio pulm  The monitor did not show hardly any readings.  Thinking maybe the leads were nor connected properly or fell off.  Do you want to repeat please call the patient and schd an appt at [facility] to have it placed again.  Thanks [employer]

## 2017-06-19 NOTE — Telephone Encounter (Signed)
Yes, they need to redo test it if the monitor was not working.

## 2017-06-19 NOTE — Telephone Encounter (Signed)
Please advise 

## 2017-06-21 ENCOUNTER — Telehealth: Payer: Self-pay | Admitting: Family Medicine

## 2017-06-21 NOTE — Telephone Encounter (Signed)
Informed patient Monitor results may be about a week.

## 2017-06-21 NOTE — Telephone Encounter (Signed)
Tried to contact patient but no answer and unfamiliar voicemail.  Will try again later.

## 2017-06-21 NOTE — Telephone Encounter (Signed)
Usually takes about a week for result to come back.

## 2017-06-21 NOTE — Telephone Encounter (Signed)
Patient wants results of Holter Monitor that she turned in on 06/18/17.

## 2017-06-24 NOTE — Telephone Encounter (Signed)
I put in a new order for Holter as below. I don't know if we need to do anything else for this to get done. [facility]

## 2017-06-24 NOTE — Telephone Encounter (Signed)
[place] states they will need a new order,Thanks

## 2017-06-26 NOTE — Telephone Encounter (Signed)
Appointment rescheduled at [facility] for 07/02/17 at 10:00.LMTCB on work number listed.Unable to contact on cell

## 2017-06-26 NOTE — Telephone Encounter (Signed)
Pt advised of appointment.

## 2017-07-02 ENCOUNTER — Ambulatory Visit: Payer: Managed Care, Other (non HMO) | Attending: Family Medicine

## 2017-08-29 ENCOUNTER — Inpatient Hospital Stay: Payer: Managed Care, Other (non HMO)

## 2017-08-29 ENCOUNTER — Inpatient Hospital Stay: Payer: Managed Care, Other (non HMO) | Attending: Internal Medicine | Admitting: Genetics

## 2017-08-29 ENCOUNTER — Encounter: Payer: Self-pay | Admitting: Genetics

## 2017-08-29 DIAGNOSIS — Z8049 Family history of malignant neoplasm of other genital organs: Secondary | ICD-10-CM

## 2017-08-29 DIAGNOSIS — Z800 Family history of malignant neoplasm of digestive organs: Secondary | ICD-10-CM

## 2017-08-29 DIAGNOSIS — Z808 Family history of malignant neoplasm of other organs or systems: Secondary | ICD-10-CM | POA: Insufficient documentation

## 2017-08-29 DIAGNOSIS — Z1379 Encounter for other screening for genetic and chromosomal anomalies: Secondary | ICD-10-CM

## 2017-08-29 DIAGNOSIS — Z801 Family history of malignant neoplasm of trachea, bronchus and lung: Secondary | ICD-10-CM | POA: Insufficient documentation

## 2017-08-29 NOTE — Progress Notes (Signed)
REFERRING PROVIDER: Birdie Sons, MD [address] [place], [place] [address]  PRIMARY PROVIDER:  Birdie Sons, MD  PRIMARY REASON FOR VISIT:  1. Family history of thyroid cancer   2. Family history of uterine cancer   3. Family history of stomach cancer   4. Family history of lung cancer     HISTORY OF PRESENT ILLNESS:   Ariel Lawson, a 52 y.o. female, was seen for a [facility] cancer genetics consultation at the request of Dr. Caryn Section due to a family history of cancer.  Ariel Lawson presents to clinic today to discuss the possibility of a hereditary predisposition to cancer, genetic testing, and to further clarify her future cancer risks, as well as potential cancer risks for family members.   Ariel Lawson is a 52 y.o. female with no personal history of cancer.  She reports having several atypical/precancerous skin lesions removed and sees a dermatologist annually.     HORMONAL RISK FACTORS:  Menarche was at age [age].  First live birth at age 25.  OCP use for approximately 0 years.  Ovaries intact: yes.  Hysterectomy: no.  Menopausal status: premenopausal.  HRT use: 0 years. Colonoscopy: no; not examined. Mammogram within the last year: no. Number of breast biopsies: 0.   Past Medical History:  Diagnosis Date  . Anemia    IDA  . Family history of lung cancer   . Family history of stomach cancer   . Family history of thyroid cancer   . Family history of uterine cancer   . Fibroids 2015   uterus- cyst  . Herpes simplex   . History of chicken pox   . Ovarian cyst    right    Past Surgical History:  Procedure Laterality Date  . LEEP  2007   Leep cone Biopsy of Cervix  . screning mammogram  09/2012   ordered through [place] employer per pt report    Social History   Socioeconomic History  . Marital status: Married    Spouse name: Not on file  . Number of children: 4  . Years of education: Not on file  . Highest education level: Not on file   Occupational History  . Not on file  Social Needs  . Financial resource strain: Not on file  . Food insecurity:    Worry: Not on file    Inability: Not on file  . Transportation needs:    Medical: Not on file    Non-medical: Not on file  Tobacco Use  . Smoking status: Never Smoker  . Smokeless tobacco: Never Used  Substance and Sexual Activity  . Alcohol use: No    Alcohol/week: 0.0 oz  . Drug use: No  . Sexual activity: Not on file  Lifestyle  . Physical activity:    Days per week: Not on file    Minutes per session: Not on file  . Stress: Not on file  Relationships  . Social connections:    Talks on phone: Not on file    Gets together: Not on file    Attends religious service: Not on file    Active member of club or organization: Not on file    Attends meetings of clubs or organizations: Not on file    Relationship status: Not on file  Other Topics Concern  . Not on file  Social History Narrative  . Not on file     FAMILY HISTORY:  We obtained a detailed, 4-generation family history.  Significant diagnoses are listed below: Family History  Problem Relation Age of Onset  . Hypertension Mother   . Diabetes Mother        type 2  . Hyperlipidemia Mother   . Anemia Mother   . Lung cancer Father 8  . Melanoma Sister 83       in remission x 8 years.   . Hypothyroidism Sister   . Thyroid cancer Sister 2  . Hypothyroidism Sister   . Uterine cancer Maternal Aunt 26  . Cancer Maternal Uncle 75       'very rare type of cancer' pt does not know the name of the cancer type  . Melanoma Maternal Grandfather 55  . Cancer Paternal Grandmother 73       mouth  . Stomach cancer Other 34  . Stomach cancer Other 61   Ariel Lawson has 2 sons and 2 daughters (ages 65-30).  Ariel Lawson has 2 sisters: -1 sister  is 39 and has a history of melanoma that was dx at 28.  She had extensive surgery and lymph nodes removed and has been in remission.  She has 4 children with no history  of cance.r  -1 sister is 55 and has a history of thyroid cancer dx at 37.  She had surgery and radiation.  She has 1 son with no history of cancer.   Ariel Lawson father: died at 70 due to lung cancer.  Paternal Aunts/Uncles: 2 paternal uncles with no history of cancer.  Paternal cousins: no history of cancer.  Paternal grandfather: died in his 39's due to heart attack.  Paternal grandmother:died at 45.  Had a history of 'mouth cancer' dx in her 39's.   Ariel Lawson mother: 71, no history of cancer.  She had her uterus and ovaries intact.  Maternal Aunts/Uncles: 3 maternal aunts and 3 maternal uncles: -1 maternal uncle died of a rare type of cancer at 108- pt does not remember the name.  She said they were told there were only 250 cases in the [place] at the time.  -1 maternal aunt was dx with uterine cancer at 52.  Pt reports she is certain it was not cervical.  This aunt had a total hysterectomy.   Maternal cousins: no history of cancer.  Maternal grandfather: died in elderly years. Hx of melanoma on his ear in his 46's Maternal grandmother:no history of cancer, died at 22.  This grandmother's sister (patient's great aunt) had stomach cancer in her 23's.  This grandmother's mother (patient's great grandmother) had stomach cancer in her 30's.   Ariel Lawson is unaware of previous family history of genetic testing for hereditary cancer risks. Patient's maternal ancestors are of is Caucasian descent, and paternal ancestors are of [place] descent. There is no reported Ashkenazi Jewish ancestry. There is no known consanguinity.  GENETIC COUNSELING ASSESSMENT: Ariel Lawson is a 52 y.o. female with a family history which is somewhat suggestive of a Hereditary Cancer Predisposition Syndrome. We, therefore, discussed and recommended the following at today's visit.   DISCUSSION: We reviewed the characteristics, features and inheritance patterns of hereditary cancer syndromes. We also discussed genetic  testing, including the appropriate family members to test, the process of testing, insurance coverage and turn-around-time for results. We discussed the implications of a negative, positive and/or variant of uncertain significant result. We recommended Ariel Lawson pursue genetic testing for the CancerNext Expanded gene panel.   The CancerNext Expanded panel analyses the following 67 genes: AIP, ALK, APC, ATM,  BAP1, BARD1, BLM, BMPR1A, BRCA1, BRCA2, BRIP1, CDH1, CDK4, CDKN1B, CDKN2A, CHEK2, DICER1, EPCAM, FANCC, FH, FLCN, GALNT12, GREM1, HOXB13, MAX, MEN1, MET,  MITF, MLH1, MRE11A, MSH2, MSH6, MUTYH, NBN , NF1, NF2, PALB2, PHOX2B, PMS2, POLD1, POLE, POT1, PRKAR1A, PTCH1 PTEN, RAD50, RAD51C, RAD51D, RB1, RET, SDHA, SDHAF2, SDHB, SDHC, SDHD, SMAD4 , SMARCA4, SMARCB1, SMARCE1, STK11, SUFU, TMEM127, TP53, TSC1, TSC2, VHL, XRCC2   We discussed that only 5-10% of cancers are associated with a Hereditary Cancer Predisposition Syndrome.  The most common hereditary cancer syndrome associated with uterine/GI cancer is Lynch Syndrome.  Lynch Syndrome is caused by mutations in the genes: MLH1, MSH2, MSH6, PMS2 and EPCAM.  This syndrome increases the risk for colon, uterine, ovarian and stomach cancers, as well as others.  Families with Lynch Syndrome tend to have multiple family members with these cancers, typically diagnosed under age 53, and diagnoses in multiple generations.     We also dicussed that there are many genes that cause many different types of cancer risks.    We discussed that if she is found to have a mutation in one of these genes, it may impact future medical management recommendations such as increased cancer screenings and consideration of risk reducing surgeries.  A positive result could also have implications for the patient's family members.  A Negative result would mean we were unable to identify a hereditary component to her cancer, but does not rule out the possibility of a hereditary basis  for her cancer.  There could be mutations that are undetectable by current technology, or in genes not yet tested or identified to increase cancer risk.    We discussed the potential to find a Variant of Uncertain Significance or VUS.  These are variants that have not yet been identified as pathogenic or benign, and it is unknown if this variant is associated with increased cancer risk or if this is a normal finding.  Most VUS's are reclassified to benign or likely benign.   It should not be used to make medical management decisions. With time, we suspect the lab will determine the significance of any VUS's identified if any.   Based on Ariel Lawson's family history of cancer, she meets medical NCCN criteria for genetic testing. Despite that she meets criteria, she may still have an out of pocket cost. The laboratory can provide her with an estimate of her OOP cost.   We discussed that some people do not want to undergo genetic testing due to fear of genetic discrimination.  A federal law called the Genetic Information Non-Discrimination Act (GINA) of 2008 helps protect individuals against genetic discrimination based on their genetic test results.  It impacts both health insurance and employment.  For health insurance, it protects against increased premiums, being kicked off insurance or being forced to take a test in order to be insured.  For employment it protects against hiring, firing and promoting decisions based on genetic test results.  Health status due to a cancer diagnosis is not protected under GINA.  This law does not protect life insurance, disability insurance, or other types of insurance.   PLAN: After considering the risks, benefits, and limitations, Ariel Lawson  provided informed consent to pursue genetic testing and the blood sample was sent to [facility] for analysis of the [employer]. Results should be available within approximately 2-3 weeks' time, at which point  they will be disclosed by t-elephone to Ariel Lawson, as will any additional recommendations warranted by these results.  Ariel Lawson will receive a summary of her genetic counseling visit and a copy of her results once available. This information will also be available in Epic. We encouraged Ariel Lawson to remain in contact with cancer genetics annually so that we can continuously update the family history and inform her of any changes in cancer genetics and testing that may be of benefit for her family. Ariel Lawson questions were answered to her satisfaction today. Our contact information was provided should additional questions or concerns arise.  Based on Ariel Lawson's family history, we recommended her mother/ maternal relatives/ siblings also have genetic counseling and testing. Ariel Lawson will let us know if we can be of any assistance in coordinating genetic counseling and/or testing for this family member.   Lastly, we encouraged Ms. Pauley to remain in contact with cancer genetics annually so that we can continuously update the family history and inform her of any changes in cancer genetics and testing that may be of benefit for this family.   Ms.  Herst questions were answered to her satisfaction today. Our contact information was provided should additional questions or concerns arise. Thank you for the referral and allowing [place] to share in the care of your patient.   Tana Felts, MS, [facility] Certified Genetic Counselor [email or web address] phone: [phone]  The patient was seen for a total of 40 minutes in face-to-face genetic counseling.

## 2017-09-09 ENCOUNTER — Inpatient Hospital Stay: Payer: Managed Care, Other (non HMO) | Attending: Radiation Oncology

## 2017-09-14 ENCOUNTER — Other Ambulatory Visit: Payer: Self-pay | Admitting: Family Medicine

## 2017-09-18 ENCOUNTER — Telehealth: Payer: Self-pay | Admitting: Genetics

## 2017-09-18 ENCOUNTER — Ambulatory Visit: Payer: Self-pay | Admitting: Genetics

## 2017-09-18 ENCOUNTER — Encounter: Payer: Self-pay | Admitting: Genetics

## 2017-09-18 DIAGNOSIS — Z1379 Encounter for other screening for genetic and chromosomal anomalies: Secondary | ICD-10-CM | POA: Insufficient documentation

## 2017-09-18 DIAGNOSIS — Z8049 Family history of malignant neoplasm of other genital organs: Secondary | ICD-10-CM

## 2017-09-18 DIAGNOSIS — Z808 Family history of malignant neoplasm of other organs or systems: Secondary | ICD-10-CM

## 2017-09-18 DIAGNOSIS — Z800 Family history of malignant neoplasm of digestive organs: Secondary | ICD-10-CM

## 2017-09-18 DIAGNOSIS — Z801 Family history of malignant neoplasm of trachea, bronchus and lung: Secondary | ICD-10-CM

## 2017-09-18 NOTE — Telephone Encounter (Signed)
Revealed negative genetic testing.  Revealed that a VUS's  in APC and EGFR were identified.   This normal result is reassuring and indicates that it is unlikely Ariel Lawson has a hereditary predisposition to cancer. s.  However, genetic testing is not perfect, and cannot definitively rule out a hereditary cause.  It will be important for her to keep in contact with genetics to learn if any additional testing may be needed in the future.     She should continue following all of her doctors screening recommendations and having skin exams.  We recommended her maternal relatives also have genetic testing.

## 2017-09-18 NOTE — Progress Notes (Signed)
HPI:  Ms. Brackeen was previously seen in the [place] clinic on 08/29/2017 due to a family history of cancer and concerns regarding a hereditary predisposition to cancer. Please refer to our prior cancer genetics clinic note for more information regarding Ms. Dittrich's medical, social and family histories, and our assessment and recommendations, at the time. Ms. Parish recent genetic test results were disclosed to her, as well as recommendations warranted by these results. These results and recommendations are discussed in more detail below.  CANCER HISTORY:  Ms. Bucher is a 52 y.o. female with no personal history of cancer.  She reports having several atypical/precancerous skin lesions removed and sees a dermatologist annually.     FAMILY HISTORY:  We obtained a detailed, 4-generation family history.  Significant diagnoses are listed below: Family History  Problem Relation Age of Onset  . Hypertension Mother   . Diabetes Mother        type 2  . Hyperlipidemia Mother   . Anemia Mother   . Lung cancer Father 15  . Melanoma Sister 38       in remission x 8 years.   . Hypothyroidism Sister   . Thyroid cancer Sister 4  . Hypothyroidism Sister   . Uterine cancer Maternal Aunt 26  . Cancer Maternal Uncle 34       'very rare type of cancer' pt does not know the name of the cancer type  . Melanoma Maternal Grandfather 52  . Cancer Paternal Grandmother 99       mouth  . Stomach cancer Other 52  . Stomach cancer Other 51    Ms. Shadle has 2 sons and 2 daughters (ages 56-30).  Ms. Brotzman has 2 sisters: -1 sister  is 11 and has a history of melanoma that was dx at 47.  She had extensive surgery and lymph nodes removed and has been in remission.  She has 4 children with no history of cance.r  -1 sister is 64 and has a history of thyroid cancer dx at 85.  She had surgery and radiation.  She has 1 son with no history of cancer.   Ms. Legacy father: died at 73 due to lung  cancer.  Paternal Aunts/Uncles: 2 paternal uncles with no history of cancer.  Paternal cousins: no history of cancer.  Paternal grandfather: died in his 68's due to heart attack.  Paternal grandmother:died at 73.  Had a history of 'mouth cancer' dx in her 65's.   Ms. Velasques mother: 37, no history of cancer.  She had her uterus and ovaries intact.  Maternal Aunts/Uncles: 3 maternal aunts and 3 maternal uncles: -1 maternal uncle died of a rare type of cancer at 58- pt does not remember the name.  She said they were told there were only 250 cases in the [place] at the time.  -1 maternal aunt was dx with uterine cancer at 61.  Pt reports she is certain it was not cervical.  This aunt had a total hysterectomy.   Maternal cousins: no history of cancer.  Maternal grandfather: died in elderly years. Hx of melanoma on his ear in his 54's Maternal grandmother:no history of cancer, died at 76.  This grandmother's sister (patient's great aunt) had stomach cancer in her 36's.  This grandmother's mother (patient's great grandmother) had stomach cancer in her 78's.   Ms. Griffey is unaware of previous family history of genetic testing for hereditary cancer risks. Patient's maternal ancestors are of is Caucasian descent,  and paternal ancestors are of [place] descent. There is no reported Ashkenazi Jewish ancestry. There is no known consanguinity.  GENETIC TEST RESULTS: Genetic testing performed through [facility]'s Multi-Cancer Panel reported out on 09/18/2017 showed no pathogenic mutations. The Multi-Cancer Panel offered by [facility] includes sequencing and/or deletion duplication testing of the following 84 genes: AIP,ALK, APC, ATM, AXIN2,BAP1,  BARD1, BLM, BMPR1A, BRCA1, BRCA2, BRIP1, CASR, CDC73, CDH1, CDK4, CDKN1B, CDKN1C, CDKN2A (p14ARF), CDKN2A (p16INK4a), CEBPA, CHEK2, CTNNA1, DICER1, DIS3L2, EGFR (c.2369C>T, p.Thr790Met variant only), EPCAM (Deletion/duplication testing only), FH, FLCN, GATA2, GPC3, GREM1  (Promoter region deletion/duplication testing only), HOXB13 (c.251G>A, p.Gly84Glu), HRAS, KIT, MAX, MEN1, MET, MITF (c.952G>A, p.Glu318Lys variant only), MLH1, MSH2, MSH3, MSH6, MUTYH, NBN, NF1, NF2, NTHL1, PALB2, PDGFRA, PHOX2B, PMS2, POLD1, POLE, POT1, PRKAR1A, PTCH1, PTEN, RAD50, RAD51C, RAD51D, RB1, RECQL4, RET, RUNX1, SDHAF2, SDHA (sequence changes only), SDHB, SDHC, SDHD, SMAD4, SMARCA4, SMARCB1, SMARCE1, STK11, SUFU, TERC, TERT, TMEM127, TP53, TSC1, TSC2, VHL, WRN and WT1. .  A variant of uncertain significance (VUS) in a gene called APC was noted. c.5194A>G (p.Met1732Val) A variant of uncertain significance (VUS) in a gene called EGFR was also noted. c.2885G>A (p.Arg962His)  The test report will be scanned into EPIC and will be located under the Molecular Pathology section of the Results Review tab. A portion of the result report is included below for reference.     We discussed with Ms. Bessler that because current genetic testing is not perfect, it is possible there may be a gene mutation in one of these genes that current testing cannot detect, but that chance is small.  We also discussed, that there could be another gene that has not yet been discovered, or that we have not yet tested, that is responsible for the cancer diagnoses in the family. It is also possible there is a hereditary cause for the cancer in the family that Ms. Rauda did not inherit and therefore was not identified in her testing.  Therefore, it is important to remain in touch with cancer genetics in the future so that we can continue to offer Ms. Habig the most up to date genetic testing.   Regarding the VUS's in APC and EGFR: At this time, it is unknown if these variants are associated with increased cancer risk or if they are normal findings, but most variants such as these get reclassified to being inconsequential. They should not be used to make medical management decisions. With time, we suspect the lab will determine  the significance of these variants, if any. If we do learn more about them, we will try to contact Ms. Fassnacht to discuss it further. However, it is important to stay in touch with [place] periodically and keep the address and phone number up to date.  ADDITIONAL GENETIC TESTING: We discussed with Ms. Bazinet that her genetic testing was fairly extensive.  If there are are genes identified to increase cancer risk that can be analyzed in the future, we would be happy to discuss and coordinate this testing at that time.    CANCER SCREENING RECOMMENDATIONS: Ms. Dinkel test result is considered negative (normal).  This means that we have not identified a hereditary predisposition to cancer in her at this time. This normal indicates that it is unlikely Ms. Ellerson has an increased risk of cancer due to a mutation in one of these genes.  While reassuring, this does not definitively rule out a hereditary predisposition to cancer. It is still possible that there could be genetic mutations that are  undetectable by current technology, or genetic mutations in genes that have not been tested or identified to increase cancer risk.  Therefore, it is recommended she continue to follow the cancer management and screening guidelines provided by her oncology and primary healthcare provider. An individual's cancer risk is not determined by genetic test results alone.   We recommended routine skin exams due to her personal history of atypical lesions and family history of melanoma.    RECOMMENDATIONS FOR FAMILY MEMBERS:  Relatives in this family might be at some increased risk of developing cancer, over the general population risk, simply due to the family history of cancer.  We recommended women in this family have a yearly mammogram beginning at age 46, or 35 years younger than the earliest onset of cancer, an annual clinical breast exam, and perform monthly breast self-exams. Women in this family should also have a  gynecological exam as recommended by their primary provider. All family members should have a colonoscopy by age 80 (or as directed by their doctors).  All family members should inform their physicians about the family history of cancer so their doctors can make the most appropriate screening recommendations for them.   It is also possible there is a hereditary cause for the cancer in Ms. Lipe's family that she did not inherit and therefore was not identified in her.   We recommended her maternal relatives also have genetic counseling and testing. Ms. Eble will let us know if we can be of any assistance in coordinating genetic counseling and/or testing for these family members.   FOLLOW-UP: Lastly, we discussed with Ms. Sturdivant that cancer genetics is a rapidly advancing field and it is possible that new genetic tests will be appropriate for her and/or her family members in the future. We encouraged her to remain in contact with cancer genetics on an annual basis so we can update her personal and family histories and let her know of advances in cancer genetics that may benefit this family.   Our contact number was provided. Ms. Insco questions were answered to her satisfaction, and she knows she is welcome to call us at anytime with additional questions or concerns.   Ferol Luz, MS, [facility] Certified Genetic Counselor [email or web address]

## 2017-10-18 ENCOUNTER — Other Ambulatory Visit: Payer: Self-pay | Admitting: Orthopedic Surgery

## 2017-10-18 DIAGNOSIS — M5441 Lumbago with sciatica, right side: Secondary | ICD-10-CM

## 2017-12-19 ENCOUNTER — Encounter: Payer: Self-pay | Admitting: Family Medicine

## 2017-12-19 ENCOUNTER — Ambulatory Visit (INDEPENDENT_AMBULATORY_CARE_PROVIDER_SITE_OTHER): Payer: Managed Care, Other (non HMO) | Admitting: Family Medicine

## 2017-12-19 VITALS — BP 118/62 | HR 98 | Temp 98.5°F | Wt 181.4 lb

## 2017-12-19 DIAGNOSIS — R109 Unspecified abdominal pain: Secondary | ICD-10-CM

## 2017-12-19 NOTE — Progress Notes (Signed)
       Patient: Ariel Lawson Female    DOB: [birth year]   52 y.o.   MRN: [id number] Visit Date: 12/19/2017  Today's Provider: Lelon Huh, MD   Chief Complaint  Patient presents with  . Abdominal Pain   Subjective:    HPI  Abdominal Pain She reports she has had three similar episodes over the last two years. Each time she stars having upper abdominal pains and progressive bloating in her stomach, lasting a couple of weeks. No lower abdominal pain and no changes in bowels except for occasional green stool. She states each episode occurred when she was eating unhealthy diet including more fast food and fatty food than she typically eats. Her current episode started about a weeks and she admits she had been off of her usually healthy diet prior to that. She has started back on what she calls her 'detox' diet a few days ago and sx have now mostly resolved she also stopped taking Aciphex about a week ago.     Allergies  Allergen Reactions  . Prevacid  [Lansoprazole] Itching     Current Outpatient Medications:  .  POLY-IRON 150 FORTE 150-25-1 MG-MCG-MG CAPS, Take 1 capsule by mouth 2 (two) times daily., Disp: , Rfl: 3 .  fluticasone (FLONASE) 50 MCG/ACT nasal spray, Place 1 spray into both nostrils 2 (two) times daily., Disp: 16 g, Rfl: 0 .  RABEprazole (ACIPHEX) 20 MG tablet, TAKE 1 TABLET BY MOUTH EVERY DAY (Patient not taking: Reported on 12/19/2017), Disp: 30 tablet, Rfl: 12 .  sodium chloride (OCEAN) 0.65 % SOLN nasal spray, Place 2 sprays into both nostrils every 2 (two) hours while awake., Disp: , Rfl: 0  Review of Systems  Constitutional: Positive for appetite change.  Respiratory: Negative.   Gastrointestinal: Positive for abdominal pain.  Genitourinary: Negative.   Musculoskeletal: Negative.   Allergic/Immunologic: Negative.   Neurological: Negative.     Social History   Tobacco Use  . Smoking status: Never Smoker  . Smokeless tobacco: Never Used  Substance Use  Topics  . Alcohol use: No    Alcohol/week: 0.0 standard drinks   Objective:   BP 118/62 (BP Location: Right Arm, Patient Position: Sitting, Cuff Size: Normal)   Pulse 98   Temp 98.5 F (36.9 C) (Oral)   Wt 181 lb 6.4 oz (82.3 kg)   LMP 12/03/2017   SpO2 99%   BMI 31.14 kg/m  Vitals:   12/19/17 0825  BP: 118/62  Pulse: 98  Temp: 98.5 F (36.9 C)  TempSrc: Oral  SpO2: 99%  Weight: 181 lb 6.4 oz (82.3 kg)     Physical Exam  General Appearance:    Alert, cooperative, no distress  Eyes:    PERRL, conjunctiva/corneas clear, EOM's intact       Lungs:     Clear to auscultation bilaterally, respirations unlabored  Heart:    Regular rate and rhythm  Abdomen:   bowel sounds present and normal in all 4 quadrants, soft, round or nontender. No CVA tenderness        Assessment & Plan:     1. Abdominal pain, unspecified abdominal location Suspect gallbladder dysfunction.  - Comprehensive metabolic panel - Amylase - US Abdomen Complete; Future       Lelon Huh, MD  [facility]

## 2017-12-20 LAB — COMPREHENSIVE METABOLIC PANEL
ALBUMIN: 4.5 g/dL (ref 3.5–5.5)
ALT: 9 IU/L (ref 0–32)
AST: 14 IU/L (ref 0–40)
Albumin/Globulin Ratio: 1.6 (ref 1.2–2.2)
Alkaline Phosphatase: 77 IU/L (ref 39–117)
BUN / CREAT RATIO: 11 (ref 9–23)
BUN: 10 mg/dL (ref 6–24)
Bilirubin Total: 0.2 mg/dL (ref 0.0–1.2)
CO2: 22 mmol/L (ref 20–29)
CREATININE: 0.89 mg/dL (ref 0.57–1.00)
Calcium: 10.5 mg/dL — ABNORMAL HIGH (ref 8.7–10.2)
Chloride: 98 mmol/L (ref 96–106)
GFR calc non Af Amer: 75 mL/min/{1.73_m2} (ref >59)
GFR, EST AFRICAN AMERICAN: 87 mL/min/{1.73_m2} (ref >59)
Globulin, Total: 2.9 g/dL (ref 1.5–4.5)
Glucose: 81 mg/dL (ref 65–99)
Potassium: 4.4 mmol/L (ref 3.5–5.2)
Sodium: 137 mmol/L (ref 134–144)
TOTAL PROTEIN: 7.4 g/dL (ref 6.0–8.5)

## 2017-12-20 LAB — AMYLASE: AMYLASE: 66 U/L (ref 31–124)

## 2017-12-24 ENCOUNTER — Ambulatory Visit: Payer: Self-pay

## 2017-12-25 ENCOUNTER — Telehealth: Payer: Self-pay

## 2017-12-25 ENCOUNTER — Ambulatory Visit
Admission: RE | Admit: 2017-12-25 | Discharge: 2017-12-25 | Disposition: A | Discharge status: Home / Self Care | Payer: Managed Care, Other (non HMO) | Source: Ambulatory Visit | Attending: Family Medicine | Admitting: Family Medicine

## 2017-12-25 DIAGNOSIS — R1010 Upper abdominal pain, unspecified: Secondary | ICD-10-CM | POA: Insufficient documentation

## 2017-12-25 DIAGNOSIS — R109 Unspecified abdominal pain: Secondary | ICD-10-CM

## 2017-12-25 NOTE — Telephone Encounter (Signed)
-----   Message from Birdie Sons, MD sent at 12/25/2017 11:31 AM EST ----- Ultrasound is completely normal. No sign of gallbladder problems. If pain continues then recommend referral to GI.

## 2017-12-25 NOTE — Telephone Encounter (Signed)
Pt advised.   Thanks,   -Laura  

## 2017-12-25 NOTE — Telephone Encounter (Signed)
Unable to reach patient at this time no voicemail has been set up yet. KW

## 2018-02-21 ENCOUNTER — Ambulatory Visit: Payer: Self-pay | Admitting: Podiatry

## 2018-02-21 ENCOUNTER — Ambulatory Visit (INDEPENDENT_AMBULATORY_CARE_PROVIDER_SITE_OTHER): Payer: 59 | Admitting: Podiatry

## 2018-02-21 ENCOUNTER — Encounter: Payer: Self-pay | Admitting: Podiatry

## 2018-02-21 VITALS — BP 131/79 | HR 79

## 2018-02-21 DIAGNOSIS — B351 Tinea unguium: Secondary | ICD-10-CM | POA: Diagnosis not present

## 2018-02-23 NOTE — Progress Notes (Signed)
   Subjective: 53 year old female presenting today as a new patient with a chief complaint of possible nail fungus of all toenails bilaterally that has been present for the past few years. She reports associated thickening and discoloration of the nails. She denies modifying factors and has not done anything for treatment. Patient is here for further evaluation and treatment.   Past Medical History:  Diagnosis Date  . Anemia    IDA  . Family history of lung cancer   . Family history of stomach cancer   . Family history of thyroid cancer   . Family history of uterine cancer   . Fibroids 2015   uterus- cyst  . Herpes simplex   . History of chicken pox   . Ovarian cyst    right    Objective: Physical Exam General: The patient is alert and oriented x3 in no acute distress.  Dermatology: Hyperkeratotic, discolored, thickened, onychodystrophy of nails noted bilaterally. Skin is warm, dry and supple bilateral lower extremities. Negative for open lesions or macerations.  Vascular: Palpable pedal pulses bilaterally. No edema or erythema noted. Capillary refill within normal limits.  Neurological: Epicritic and protective threshold grossly intact bilaterally.   Musculoskeletal Exam: Range of motion within normal limits to all pedal and ankle joints bilateral. Muscle strength 5/5 in all groups bilateral.   Assessment: #1 onychomycosis digits 1-5 bilateral   Plan of Care:  #1 Patient was evaluated. #2 Discussed different treatment options.  #3 Declined oral medications.  #4 Appointment with Janett Billow, RN for laser treatment.  #5 Return to clinic as needed.    Edrick Kins, DPM [facility]  Dr. Edrick Kins, [place]                                        [place], [place] [address]                Office [phone]  Fax [phone]

## 2018-03-02 ENCOUNTER — Other Ambulatory Visit: Payer: Self-pay | Admitting: Family Medicine

## 2018-03-04 ENCOUNTER — Ambulatory Visit: Payer: Self-pay

## 2018-03-04 DIAGNOSIS — B351 Tinea unguium: Secondary | ICD-10-CM

## 2018-03-10 NOTE — Progress Notes (Signed)
Pt presents with mycotic infection of nails 1-5 bilateral.  All other systems are negative  Laser therapy administered to affected nails and tolerated well. All safety precautions were in place.  2nd treatment.  Follow up in 4 weeks     

## 2018-03-19 ENCOUNTER — Other Ambulatory Visit: Payer: Self-pay | Admitting: Family Medicine

## 2018-04-01 ENCOUNTER — Ambulatory Visit: Payer: Managed Care, Other (non HMO)

## 2018-04-01 DIAGNOSIS — B351 Tinea unguium: Secondary | ICD-10-CM

## 2018-04-01 NOTE — Progress Notes (Signed)
Pt presents with mycotic infection of nails 1-5 bilateral.  All other systems are negative  Laser therapy administered to affected nails and tolerated well. All safety precautions were in place.  3rd treatment.  Follow up in 4 weeks     

## 2018-04-28 ENCOUNTER — Encounter: Payer: Managed Care, Other (non HMO) | Admitting: Family Medicine

## 2018-04-29 ENCOUNTER — Encounter: Payer: Managed Care, Other (non HMO) | Admitting: Family Medicine

## 2018-04-30 ENCOUNTER — Other Ambulatory Visit: Payer: Managed Care, Other (non HMO)

## 2018-04-30 ENCOUNTER — Other Ambulatory Visit: Payer: Self-pay

## 2018-04-30 ENCOUNTER — Encounter: Payer: Self-pay | Admitting: Family Medicine

## 2018-04-30 MED ORDER — POLY-IRON 150 FORTE 150-25-1 MG-MCG-MG PO CAPS: 1.0000 | ORAL_CAPSULE | Freq: Two times a day (BID) | ORAL | 5 refills | Status: DC

## 2018-04-30 NOTE — Telephone Encounter (Signed)
04/30/2018- Please review message below. Patient requesting Refill. Please review this request. I placed a separate refill request for her husband in his chart. ---R. Chambers.   Subject: Non-Urgent Medical Question             Good Morning Dr. Caryn Section,  [facility] and I were scheduled for our yearly visits, however, they were cancelled and rescheduled for May 26. That is fine, however, we have medication issues now. I need more poly-iron 150 forte capsules as I am almost out, and Jose needs more Xanax as he only has 9 pills left. It normally takes [place] a couple of months to get through one bottle, however, with the anxiety level that he has due to the coronavirus chaos, he has been taking one each night before he goes to bed to help him sleep. We [facility] on [address]. Could you please let me know if these two medicines are something you can help [place] get refilled as soon as possible? Thank you so much and we look forward to see you on May 26th. Stay safe and healthy.    Thank you,    Jose and [employer]

## 2018-06-11 ENCOUNTER — Other Ambulatory Visit: Payer: Managed Care, Other (non HMO)

## 2018-06-16 ENCOUNTER — Ambulatory Visit (INDEPENDENT_AMBULATORY_CARE_PROVIDER_SITE_OTHER): Payer: Self-pay

## 2018-06-16 ENCOUNTER — Other Ambulatory Visit: Payer: Self-pay

## 2018-06-16 DIAGNOSIS — B351 Tinea unguium: Secondary | ICD-10-CM

## 2018-06-19 NOTE — Progress Notes (Signed)
Pt presents with mycotic infection of nails 1-5 bilateral  All other systems are negative  Laser therapy administered to affected nails and tolerated well. All safety precautions were in place. 4th treatment.  Follow up in 4 weeks     

## 2018-07-01 ENCOUNTER — Other Ambulatory Visit: Payer: Self-pay

## 2018-07-01 ENCOUNTER — Ambulatory Visit (INDEPENDENT_AMBULATORY_CARE_PROVIDER_SITE_OTHER): Payer: Managed Care, Other (non HMO) | Admitting: Family Medicine

## 2018-07-01 ENCOUNTER — Encounter: Payer: Self-pay | Admitting: Family Medicine

## 2018-07-01 VITALS — BP 122/81 | HR 85 | Temp 99.1°F | Resp 16 | Ht 63.5 in | Wt 179.0 lb

## 2018-07-01 DIAGNOSIS — E559 Vitamin D deficiency, unspecified: Secondary | ICD-10-CM | POA: Diagnosis not present

## 2018-07-01 DIAGNOSIS — Z0000 Encounter for general adult medical examination without abnormal findings: Secondary | ICD-10-CM

## 2018-07-01 DIAGNOSIS — D509 Iron deficiency anemia, unspecified: Secondary | ICD-10-CM | POA: Diagnosis not present

## 2018-07-01 DIAGNOSIS — K2100 Gastro-esophageal reflux disease with esophagitis, without bleeding: Secondary | ICD-10-CM

## 2018-07-01 NOTE — Progress Notes (Signed)
Patient: Ariel Lawson, Female    DOB: [birth year], 53 y.o.   MRN: [id number] Visit Date: 07/01/2018  Today's Provider: Lelon Huh, MD   Chief Complaint  Patient presents with  . Annual Exam  . Anemia   Subjective:     Annual physical exam Ariel Lawson is a 53 y.o. female who presents today for health maintenance and complete physical. She feels fairly well. She reports exercising 3 times weekly. She reports she is sleeping well.  -----------------------------------------------------------------   Follow up for Iron Deficiency Anemia:  The patient was last seen for this 1 years ago. Changes made at last visit include advising patient to get more iron rich foods in her diet. Lab Results  Component Value Date   FERRITIN 13 (L) 06/13/2017   Lab Results  Component Value Date   WBC 5.5 06/13/2017   HGB 11.4 06/13/2017   HCT 34.9 06/13/2017   MCV 87 06/13/2017   PLT 270 06/13/2017    She reports good compliance with treatment. She feels that condition is Improved. She is not having side effects.   She also continues on rabeprazole for GERD which she states continues to work well.  ------------------------------------------------------------------------------------  Review of Systems  Constitutional: Negative for chills, fatigue and fever.  HENT: Negative for congestion, ear pain, rhinorrhea, sneezing and sore throat.   Eyes: Negative.  Negative for pain and redness.  Respiratory: Negative for cough, shortness of breath and wheezing.   Cardiovascular: Negative for chest pain and leg swelling.  Gastrointestinal: Negative for abdominal pain, blood in stool, constipation, diarrhea and nausea.  Endocrine: Negative for polydipsia and polyphagia.  Genitourinary: Negative.  Negative for dysuria, flank pain, hematuria, pelvic pain, vaginal bleeding and vaginal discharge.  Musculoskeletal: Negative for arthralgias, back pain, gait problem and joint swelling.   Skin: Negative for rash.  Neurological: Negative.  Negative for dizziness, tremors, seizures, weakness, light-headedness, numbness and headaches.  Hematological: Negative for adenopathy.  Psychiatric/Behavioral: Negative.  Negative for behavioral problems, confusion and dysphoric mood. The patient is not nervous/anxious and is not hyperactive.     Social History      She  reports that she has never smoked. She has never used smokeless tobacco. She reports that she does not drink alcohol or use drugs.       Social History   Socioeconomic History  . Marital status: Married    Spouse name: Not on file  . Number of children: 4  . Years of education: Not on file  . Highest education level: Not on file  Occupational History  . Not on file  Social Needs  . Financial resource strain: Not on file  . Food insecurity:    Worry: Not on file    Inability: Not on file  . Transportation needs:    Medical: Not on file    Non-medical: Not on file  Tobacco Use  . Smoking status: Never Smoker  . Smokeless tobacco: Never Used  Substance and Sexual Activity  . Alcohol use: No    Alcohol/week: 0.0 standard drinks  . Drug use: No  . Sexual activity: Not on file  Lifestyle  . Physical activity:    Days per week: Not on file    Minutes per session: Not on file  . Stress: Not on file  Relationships  . Social connections:    Talks on phone: Not on file    Gets together: Not on file    Attends religious service: Not on  file    Active member of club or organization: Not on file    Attends meetings of clubs or organizations: Not on file    Relationship status: Not on file  Other Topics Concern  . Not on file  Social History Narrative  . Not on file    Past Medical History:  Diagnosis Date  . Anemia    IDA  . Family history of lung cancer   . Family history of stomach cancer   . Family history of thyroid cancer   . Family history of uterine cancer   . Fibroids 2015   uterus- cyst  .  Herpes simplex   . History of chicken pox   . Ovarian cyst    right     Patient Active Problem List   Diagnosis Date Noted  . Genetic testing 09/18/2017  . Family history of thyroid cancer   . Family history of uterine cancer   . Family history of stomach cancer   . Family history of lung cancer   . Panic attack 07/13/2015  . PAC (premature atrial contraction) 07/12/2015  . Greater trochanteric bursitis of right hip 02/23/2015  . Dysphagia 11/12/2014  . Obesity 11/12/2014  . Palpitations 11/12/2014  . Reflux esophagitis 11/12/2014  . Back muscle spasm 11/12/2014  . Edema 08/20/2006  . Anemia, iron deficiency 08/07/2006  . 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 GERD (gastroesophageal reflux disease) 02/05/2002  . Uncomplicated herpes simplex 02/05/2001    Past Surgical History:  Procedure Laterality Date  . LEEP  2007   Leep cone Biopsy of Cervix  . screning mammogram  09/2012   ordered through [place] employer per pt report    Family History        Family Status  Relation Name Status  . Mother  Alive       Myxedema  . Father  Deceased       lung cancer  . Sister  Alive  . Sister  Alive  . Mat Aunt  Deceased  . Mat Uncle  Deceased  . MGF  (Not Specified)  . PGM  (Not Specified)  . Other [place] Deceased  . Other [place] Deceased        Her family history includes Anemia in her mother; Cancer (age of onset: 61) in her maternal uncle and paternal grandmother; Diabetes in her mother; Hyperlipidemia in her mother; Hypertension in her mother; Hypothyroidism in her sister and sister; Lung cancer (age of onset: 26) in her father; Melanoma (age of onset: 66) in her sister; Melanoma (age of onset: 82) in her maternal grandfather; Stomach cancer (age of onset: 4) in an other family member; Stomach cancer (age of onset: 70) in an other family member; Thyroid cancer (age of onset: 25) in her sister; Uterine cancer (age of onset: 25) in her maternal aunt.      Allergies   Allergen Reactions  . Prevacid  [Lansoprazole] Itching     Current Outpatient Medications:  .  POLY-IRON 150 FORTE 150-25-1 MG-MCG-MG CAPS, Take 1 capsule by mouth 2 (two) times daily., Disp: 100 each, Rfl: 5 .  RABEprazole (ACIPHEX) 20 MG tablet, TAKE 1 TABLET BY MOUTH EVERY DAY, Disp: 90 tablet, Rfl: 4   Patient Care Team: Birdie Sons, MD as PCP - General (Family Medicine) Brendolyn Patty, MD (Dermatology) Tiajuana Amass, MD as Referring Physician (Allergy and Immunology)    Objective:    Vitals: BP 122/81 (BP Location: Right Arm, Patient Position: Sitting,  Cuff Size: Large)   Pulse 85   Temp 99.1 F (37.3 C) (Oral)   Resp 16   Ht 5' 3.5" (1.613 m)   Wt 179 lb (81.2 kg)   SpO2 99% Comment: room air  BMI 31.21 kg/m    Vitals:   07/01/18 1439  BP: 122/81  Pulse: 85  Resp: 16  Temp: 99.1 F (37.3 C)  TempSrc: Oral  SpO2: 99%  Weight: 179 lb (81.2 kg)  Height: 5' 3.5" (1.613 m)     Physical Exam   General Appearance:    Alert, cooperative, no distress, appears stated age  Head:    Normocephalic, without obvious abnormality, atraumatic  Eyes:    PERRL, conjunctiva/corneas clear, EOM's intact, fundi    benign, both eyes  Ears:    Normal TM's and external ear canals, both ears  Nose:   Nares normal, septum midline, mucosa normal, no drainage    or sinus tenderness  Throat:   Lips, mucosa, and tongue normal; teeth and gums normal  Neck:   Supple, symmetrical, trachea midline, no adenopathy;    thyroid:  no enlargement/tenderness/nodules; no carotid   bruit or JVD  Back:     Symmetric, no curvature, ROM normal, no CVA tenderness  Lungs:     Clear to auscultation bilaterally, respirations unlabored  Chest Wall:    No tenderness or deformity   Heart:    Regular rate and rhythm, S1 and S2 normal, no murmur, rub   or gallop  Breast Exam:    normal appearance, no masses or tenderness  Abdomen:     Soft, non-tender, bowel sounds active all four quadrants,    no  masses, no organomegaly  Pelvic:    deferred  Extremities:   Extremities normal, atraumatic, no cyanosis or edema  Pulses:   2+ and symmetric all extremities  Skin:   Skin color, texture, turgor normal, no rashes or lesions  Lymph nodes:   Cervical, supraclavicular, and axillary nodes normal  Neurologic:   CNII-XII intact, normal strength, sensation and reflexes    throughout    Depression Screen PHQ 2/9 Scores 07/01/2018 06/10/2017 04/23/2016  PHQ - 2 Score 0 0 1  PHQ- 9 Score 0 1 4       Assessment & Plan:     Routine Health Maintenance and Physical Exam  Exercise Activities and Dietary recommendations Goals   None      There is no immunization history on file for this patient.  Health Maintenance  Topic Date Due  . TETANUS/TDAP  12/15/1985  . MAMMOGRAM  12/15/2016  . COLONOSCOPY  12/15/2016  . PAP SMEAR-Modifier  04/23/2021  . HIV Screening  Completed     Discussed health benefits of physical activity, and encouraged her to engage in regular exercise appropriate for her age and condition.    --------------------------------------------------------------------  1. Annual physical exam Generally doing well, mildly overweight. Encouraged healthy diet and exercise.  - Comprehensive metabolic panel - Lipid panel - T4, free - TSH  2. Reflux esophagitis Well controlled on rabeprazole   - Magnesium  3. Iron deficiency anemia, unspecified iron deficiency anemia type check - Ferritin - CBC  4. Vitamin D deficiency  - VITAMIN D 25 Hydroxy (Vit-D Deficiency, Fractures)  Discussed recommended HM as per patient instructions.   Lelon Huh, MD  [facility]

## 2018-07-01 NOTE — Patient Instructions (Addendum)
.   Please go to the lab draw station in [address] on the second floor of [facility]  when you are fasting for 8 hours. Normal hours are 8:00am to 12:30pm and 1:30pm to 4:00pm Monday through Friday  . Please review the attached list of medications and notify my office if there are any errors.   . Please bring all of your medications to every appointment so we can make sure that our medication list is the same as yours.   . Please call the [facility] [phone]) to schedule a routine screening mammogram.   The CDC recommends two doses of Shingrix (the shingles vaccine) separated by 2 to 6 months for adults age 52 years and older. I recommend checking with your insurance plan regarding coverage for this vaccine.   . You are due for a Tdap (tetanus-diptheria-pertussis vaccine) which protects you from tetanus and whooping cough. Please check with your insurance plan or pharmacy regarding coverage for this vaccine.

## 2018-07-04 ENCOUNTER — Telehealth: Payer: Self-pay

## 2018-07-04 LAB — CBC
Hematocrit: 38.9 % (ref 34.0–46.6)
Hemoglobin: 13 g/dL (ref 11.1–15.9)
MCH: 29 pg (ref 26.6–33.0)
MCHC: 33.4 g/dL (ref 31.5–35.7)
MCV: 87 fL (ref 79–97)
Platelets: 224 10*3/uL (ref 150–450)
RBC: 4.49 x10E6/uL (ref 3.77–5.28)
RDW: 14 % (ref 11.7–15.4)
WBC: 7.7 10*3/uL (ref 3.4–10.8)

## 2018-07-04 LAB — COMPREHENSIVE METABOLIC PANEL
ALT: 8 IU/L (ref 0–32)
AST: 10 IU/L (ref 0–40)
Albumin/Globulin Ratio: 1.8 (ref 1.2–2.2)
Albumin: 4.3 g/dL (ref 3.8–4.9)
Alkaline Phosphatase: 70 IU/L (ref 39–117)
BUN/Creatinine Ratio: 15 (ref 9–23)
BUN: 13 mg/dL (ref 6–24)
Bilirubin Total: 0.3 mg/dL (ref 0.0–1.2)
CO2: 20 mmol/L (ref 20–29)
Calcium: 9.3 mg/dL (ref 8.7–10.2)
Chloride: 103 mmol/L (ref 96–106)
Creatinine, Ser: 0.84 mg/dL (ref 0.57–1.00)
GFR calc Af Amer: 93 mL/min/{1.73_m2} (ref >59)
GFR calc non Af Amer: 81 mL/min/{1.73_m2} (ref >59)
Globulin, Total: 2.4 g/dL (ref 1.5–4.5)
Glucose: 95 mg/dL (ref 65–99)
Potassium: 4.3 mmol/L (ref 3.5–5.2)
Sodium: 139 mmol/L (ref 134–144)
Total Protein: 6.7 g/dL (ref 6.0–8.5)

## 2018-07-04 LAB — T4, FREE: Free T4: 1.17 ng/dL (ref 0.82–1.77)

## 2018-07-04 LAB — TSH: TSH: 2.63 u[IU]/mL (ref 0.450–4.500)

## 2018-07-04 LAB — LIPID PANEL
Chol/HDL Ratio: 3.2 ratio (ref 0.0–4.4)
Cholesterol, Total: 159 mg/dL (ref 100–199)
HDL: 50 mg/dL (ref >39)
LDL Calculated: 97 mg/dL (ref 0–99)
Triglycerides: 58 mg/dL (ref 0–149)
VLDL Cholesterol Cal: 12 mg/dL (ref 5–40)

## 2018-07-04 LAB — MAGNESIUM: Magnesium: 1.9 mg/dL (ref 1.6–2.3)

## 2018-07-04 LAB — FERRITIN: Ferritin: 9 ng/mL — ABNORMAL LOW (ref 15–150)

## 2018-07-04 LAB — VITAMIN D 25 HYDROXY (VIT D DEFICIENCY, FRACTURES): Vit D, 25-Hydroxy: 32.8 ng/mL (ref 30.0–100.0)

## 2018-07-04 NOTE — Telephone Encounter (Signed)
-----   Message from Birdie Sons, MD sent at 07/04/2018  9:32 AM EDT ----- Iron stores (ferritin) level si low at 9, it should be above 15, however she is not anemic, so levels are not low enough to cause any health problems. She should continue trying to get as much iron rich healthy foods as she can, such as dark green leafy veggies, fish, and lean poultry. And take an OTC iron supplement as much as she can tolerate.  Otherwise all labs including cholesterol and sugar are very good.

## 2018-07-04 NOTE — Telephone Encounter (Signed)
Tried calling patient, no answer. Will try again later.  

## 2018-07-07 NOTE — Telephone Encounter (Signed)
Pt called back and was advised of lab results and instructions of OTC Iron tablets.  dbs

## 2018-07-07 NOTE — Telephone Encounter (Signed)
LMTCB 07/07/2018  Thanks,   -Parisa Pinela  

## 2018-07-17 ENCOUNTER — Ambulatory Visit (INDEPENDENT_AMBULATORY_CARE_PROVIDER_SITE_OTHER): Payer: Managed Care, Other (non HMO) | Admitting: Podiatry

## 2018-07-17 ENCOUNTER — Other Ambulatory Visit: Payer: Self-pay

## 2018-07-17 DIAGNOSIS — B351 Tinea unguium: Secondary | ICD-10-CM

## 2018-07-21 NOTE — Progress Notes (Signed)
Pt presents with mycotic infection of nails 1-5 bilateral  All other systems are negative  Laser therapy administered to affected nails and tolerated well. All safety precautions were in place.  5th treatment.  Follow up in 4 weeks

## 2018-08-15 ENCOUNTER — Ambulatory Visit (INDEPENDENT_AMBULATORY_CARE_PROVIDER_SITE_OTHER): Payer: Managed Care, Other (non HMO) | Admitting: Podiatry

## 2018-08-15 ENCOUNTER — Other Ambulatory Visit: Payer: Self-pay

## 2018-08-15 DIAGNOSIS — B351 Tinea unguium: Secondary | ICD-10-CM

## 2018-09-12 ENCOUNTER — Ambulatory Visit: Payer: Self-pay | Admitting: Podiatry

## 2018-09-12 ENCOUNTER — Other Ambulatory Visit: Payer: Self-pay

## 2018-09-12 DIAGNOSIS — B351 Tinea unguium: Secondary | ICD-10-CM

## 2018-09-18 NOTE — Progress Notes (Signed)
Pt presents with mycotic infection of nails 1-5 bilateral  All other systems are negative  Laser therapy administered to affected nails and tolerated well. All safety precautions were in place.  6th treatment.  Follow up in 4 weeks

## 2018-10-10 NOTE — Progress Notes (Signed)
Pt presents with mycotic infection of nails 1-5 bilateral  All other systems are negative  Laser therapy administered to affected nails and tolerated well. All safety precautions were in place.  Follow-up PRN

## 2018-11-11 ENCOUNTER — Other Ambulatory Visit: Payer: Self-pay

## 2018-11-11 ENCOUNTER — Encounter: Payer: Self-pay | Admitting: Podiatry

## 2018-11-11 ENCOUNTER — Ambulatory Visit: Payer: Managed Care, Other (non HMO) | Admitting: Podiatry

## 2018-11-11 DIAGNOSIS — B351 Tinea unguium: Secondary | ICD-10-CM

## 2018-11-13 NOTE — Progress Notes (Signed)
   Subjective: 53 year old female presenting today for follow up evaluation of onychomycosis of digits 1-5 bilaterally. She has been getting laser treatment for her nails and states they are improving. She denies worsening factors. Patient is here for further evaluation and treatment.    Past Medical History:  Diagnosis Date  . Anemia    IDA  . Family history of lung cancer   . Family history of stomach cancer   . Family history of thyroid cancer   . Family history of uterine cancer   . Fibroids 2015   uterus- cyst  . Herpes simplex   . History of chicken pox   . Ovarian cyst    right    Objective: Physical Exam General: The patient is alert and oriented x3 in no acute distress.  Dermatology: Hyperkeratotic, discolored, thickened, onychodystrophy of nails noted bilaterally. Skin is warm, dry and supple bilateral lower extremities. Negative for open lesions or macerations.  Vascular: Palpable pedal pulses bilaterally. No edema or erythema noted. Capillary refill within normal limits.  Neurological: Epicritic and protective threshold grossly intact bilaterally.   Musculoskeletal Exam: Range of motion within normal limits to all pedal and ankle joints bilateral. Muscle strength 5/5 in all groups bilateral.   Assessment: #1 onychomycosis digits 1-5 bilateral - improved   Plan of Care:  #1 Patient was evaluated. #2 Recommended topical Formula 3 antifungal during the winter months.  #3 Patient may want to resume laser therapy in the spring.  #4 Return to clinic as needed.    Edrick Kins, DPM [facility]  Dr. Edrick Kins, [address]                                        [place], [place] [address]                Office [phone]  Fax [phone]

## 2018-11-18 ENCOUNTER — Ambulatory Visit: Payer: Managed Care, Other (non HMO) | Admitting: Family Medicine

## 2018-11-18 ENCOUNTER — Other Ambulatory Visit: Payer: Self-pay

## 2018-11-18 ENCOUNTER — Encounter: Payer: Self-pay | Admitting: Family Medicine

## 2018-11-18 VITALS — BP 122/77 | HR 69 | Temp 96.2°F | Resp 16 | Wt 178.6 lb

## 2018-11-18 DIAGNOSIS — D509 Iron deficiency anemia, unspecified: Secondary | ICD-10-CM

## 2018-11-18 DIAGNOSIS — N926 Irregular menstruation, unspecified: Secondary | ICD-10-CM

## 2018-11-18 DIAGNOSIS — R42 Dizziness and giddiness: Secondary | ICD-10-CM

## 2018-11-18 NOTE — Patient Instructions (Signed)
.   Please review the attached list of medications and notify my office if there are any errors.   . Please bring all of your medications to every appointment so we can make sure that our medication list is the same as yours.   . It is especially important to get the annual flu vaccine this year. If you haven't had it already, please go to your pharmacy or call the office as soon as possible to schedule you flu shot.  . Please call the [facility] [phone]) to schedule a routine screening mammogram.

## 2018-11-18 NOTE — Progress Notes (Signed)
Patient: Ariel Lawson Female    DOB: [birth year]   53 y.o.   MRN: [id number] Visit Date: 11/18/2018  Today's Provider: Lelon Huh, MD   Chief Complaint  Patient presents with  . Dizziness   Subjective:     Dizziness This is a new problem. The current episode started 1 to 4 weeks ago. The problem occurs daily. The problem has been unchanged. Associated symptoms include arthralgias (elbows), headaches, nausea, numbness, vertigo and weakness. Pertinent negatives include no abdominal pain, anorexia, change in bowel habit, chest pain, chills, congestion, coughing, diaphoresis, fatigue, fever, joint swelling, myalgias, neck pain, rash, sore throat, swollen glands, urinary symptoms, visual change or vomiting. Nothing aggravates the symptoms. She has tried nothing for the symptoms.  Patient also reports fluctuation in blood pressure when she has episodes of feeling light headed,patient states headaches usually occur. Patient reports recent high blood pressure of 140/90 and states that she has noticed episodes of "hot flashes." Patient reports over the past 3 months she has noticed that her menstrual cycle has become irregular and she states that she has concerns of being menopausal. States she had no menses in august and only a few days of light bleeding in September. She does have history iron deficient anemia attributed to DUB in the past and reports she is currently taking iron supplement once every day consistently.  She states the dizziness above is a light headed feeling she gets when she stands up quickly and nearly passed out on at least one occasion. Has rare heart palpations, no chest pains, no nausea or vomiting. Did wake up with numbness in her left leg and arms several days ago but symptoms resolved after getting up and shaking it off for a few minutes, and has had no other episodes.   Allergies  Allergen Reactions  . Prevacid  [Lansoprazole] Itching     Current Outpatient  Medications:  .  Adapalene 0.3 % gel, APPLY ONTO THE SKIN AT BEDTIME FOR ACNE, Disp: , Rfl:  .  POLY-IRON 150 FORTE 150-25-1 MG-MCG-MG CAPS, Take 1 capsule by mouth 2 (two) times daily., Disp: 100 each, Rfl: 5 .  RABEprazole (ACIPHEX) 20 MG tablet, TAKE 1 TABLET BY MOUTH EVERY DAY, Disp: 90 tablet, Rfl: 4  Review of Systems  Constitutional: Negative for chills, diaphoresis, fatigue and fever.  HENT: Negative for congestion and sore throat.   Respiratory: Negative for cough.   Cardiovascular: Negative for chest pain.  Gastrointestinal: Positive for nausea. Negative for abdominal pain, anorexia, change in bowel habit and vomiting.  Musculoskeletal: Positive for arthralgias (elbows). Negative for joint swelling, myalgias and neck pain.  Skin: Negative for rash.  Neurological: Positive for dizziness, vertigo, weakness, numbness and headaches.    Social History   Tobacco Use  . Smoking status: Never Smoker  . Smokeless tobacco: Never Used  Substance Use Topics  . Alcohol use: No    Alcohol/week: 0.0 standard drinks      Objective:   BP 122/77   Pulse 69   Temp (!) 96.2 F (35.7 C) (Oral)   Resp 16   Wt 178 lb 9.6 oz (81 kg)   BMI 31.14 kg/m     Physical Exam   General Appearance:    Overweight female in no acute distress  Eyes:    PERRL, conjunctiva/corneas clear, EOM's intact       Lungs:     Clear to auscultation bilaterally, respirations unlabored  Heart:    Normal heart rate.  Normal rhythm. No murmurs, rubs, or gallops.   MS:   All extremities are intact.   Neurologic:   Awake, alert, oriented x 3. No apparent focal neurological           defect.           Assessment & Plan    1. Iron deficiency anemia, unspecified iron deficiency anemia type  - CBC - Ferritin  2. Dizziness  - Comprehensive metabolic panel - TSH - Magnesium - EKG 12-Lead  3. Irregular menstruation  - hCG, serum, qualitative - FSH/LH   The entirety of the information documented in  the History of Present Illness, Review of Systems and Physical Exam were personally obtained by me. Portions of this information were initially documented by Minette Headland, CMA and reviewed by me for thoroughness and accuracy.      Lelon Huh, MD  [facility]

## 2018-11-19 ENCOUNTER — Telehealth: Payer: Self-pay

## 2018-11-19 LAB — COMPREHENSIVE METABOLIC PANEL
ALT: 11 IU/L (ref 0–32)
AST: 14 IU/L (ref 0–40)
Albumin/Globulin Ratio: 1.7 (ref 1.2–2.2)
Albumin: 4.5 g/dL (ref 3.8–4.9)
Alkaline Phosphatase: 81 IU/L (ref 39–117)
BUN/Creatinine Ratio: 17 (ref 9–23)
BUN: 15 mg/dL (ref 6–24)
Bilirubin Total: 0.3 mg/dL (ref 0.0–1.2)
CO2: 20 mmol/L (ref 20–29)
Calcium: 9.2 mg/dL (ref 8.7–10.2)
Chloride: 106 mmol/L (ref 96–106)
Creatinine, Ser: 0.87 mg/dL (ref 0.57–1.00)
GFR calc Af Amer: 89 mL/min/{1.73_m2} (ref >59)
GFR calc non Af Amer: 77 mL/min/{1.73_m2} (ref >59)
Globulin, Total: 2.7 g/dL (ref 1.5–4.5)
Glucose: 94 mg/dL (ref 65–99)
Potassium: 4.3 mmol/L (ref 3.5–5.2)
Sodium: 141 mmol/L (ref 134–144)
Total Protein: 7.2 g/dL (ref 6.0–8.5)

## 2018-11-19 LAB — FSH/LH
FSH: 64.4 m[IU]/mL
LH: 54.8 m[IU]/mL

## 2018-11-19 LAB — CBC
Hematocrit: 41 % (ref 34.0–46.6)
Hemoglobin: 13.4 g/dL (ref 11.1–15.9)
MCH: 28.7 pg (ref 26.6–33.0)
MCHC: 32.7 g/dL (ref 31.5–35.7)
MCV: 88 fL (ref 79–97)
Platelets: 169 10*3/uL (ref 150–450)
RBC: 4.67 x10E6/uL (ref 3.77–5.28)
RDW: 14.5 % (ref 11.7–15.4)
WBC: 4.8 10*3/uL (ref 3.4–10.8)

## 2018-11-19 LAB — TSH: TSH: 1.98 u[IU]/mL (ref 0.450–4.500)

## 2018-11-19 LAB — MAGNESIUM: Magnesium: 2.1 mg/dL (ref 1.6–2.3)

## 2018-11-19 LAB — FERRITIN: Ferritin: 14 ng/mL — ABNORMAL LOW (ref 15–150)

## 2018-11-19 LAB — HCG, SERUM, QUALITATIVE: hCG,Beta Subunit,Qual,Serum: NEGATIVE m[IU]/mL (ref <6)

## 2018-11-19 NOTE — Telephone Encounter (Signed)
Pt.Advised. KW 

## 2018-11-19 NOTE — Telephone Encounter (Signed)
-----   Message from Birdie Sons, MD sent at 11/19/2018  7:51 AM EDT ----- Iron levels are low, although blood cell count is normal. FSH is high indicating she is nearing the end of menopause and may stop having periods completely soon. Continue iron supplements. Try increasing fluid intake, if she continues to have dizzy spells then we will need to refer to cardiology for further evaluation.

## 2018-11-23 ENCOUNTER — Encounter: Payer: Self-pay | Admitting: Family Medicine

## 2018-12-16 ENCOUNTER — Ambulatory Visit (INDEPENDENT_AMBULATORY_CARE_PROVIDER_SITE_OTHER): Payer: Managed Care, Other (non HMO) | Admitting: Obstetrics & Gynecology

## 2018-12-16 ENCOUNTER — Other Ambulatory Visit: Payer: Self-pay

## 2018-12-16 ENCOUNTER — Encounter: Payer: Self-pay | Admitting: Obstetrics & Gynecology

## 2018-12-16 VITALS — BP 130/80 | Ht 64.0 in | Wt 184.0 lb

## 2018-12-16 DIAGNOSIS — N938 Other specified abnormal uterine and vaginal bleeding: Secondary | ICD-10-CM | POA: Diagnosis not present

## 2018-12-16 DIAGNOSIS — N951 Menopausal and female climacteric states: Secondary | ICD-10-CM | POA: Diagnosis not present

## 2018-12-16 DIAGNOSIS — Z1231 Encounter for screening mammogram for malignant neoplasm of breast: Secondary | ICD-10-CM | POA: Diagnosis not present

## 2018-12-16 NOTE — Patient Instructions (Signed)
Transvaginal Ultrasound A transvaginal ultrasound, also called an endovaginal ultrasound, is a test that uses sound waves to take pictures of the female genital tract. The pictures are taken with a device, called a transducer, that is placed in the vagina. This test may be done to:  Check for problems with your pregnancy.  Check your developing baby.  Check for anything abnormal in the uterus or ovaries.  Find out why you have pelvic pain or bleeding. Tell a health care provider about:  Any allergies you have.  All medicines you are taking, including vitamins, herbs, eye drops, creams, and over-the-counter medicines.  Any blood disorders you have.  Any surgeries you have had.  Any medical conditions you have.  Whether you are pregnant or may be pregnant.  Whether you are having your menstrual period. What are the risks? This is a safe procedure. There are no known risks or complications of having this test. What happens before the procedure? This procedure needs to be done when your bladder is empty. Follow your health care provider's instructions about drinking fluids and emptying your bladder before the test. What happens during the procedure?   You will empty your bladder before the procedure.  You will undress from the waist down.  You will lie down on an exam table, with your knees bent and your feet in foot holders.  A health care provider will cover the transducer with a sterile cover.  A gel will be put on the transducer. The gel helps transmit the sound waves and prevents irritation of your vagina.  The technician will insert the transducer into your vagina to get images. These will be displayed on a monitor that looks like a small television screen.  The transducer will be removed when the procedure is complete. The procedure may vary among health care providers and hospitals. What happens after the procedure?  It is up to you to get the results of your  procedure. Ask your health care provider, or the department that is doing the procedure, when your results will be ready.  Keep all follow-up visits as told by your health care provider. This is important. Summary  A transvaginal ultrasound, also called an endovaginal ultrasound, is a test that uses sound waves to take pictures of the female genital tract.  This is a safe procedure. There are no known risks associated with this test.  The procedure needs to be done when your bladder is empty. Follow your health care provider's instructions about drinking fluids and emptying your bladder before the test.  During the procedure, you will undress from the waist down and lie down on an exam table. A technician will insert a transducer into your vagina to obtain images.  Ask your health care provider, or the department that is doing the procedure, when your results will be ready. This information is not intended to replace advice given to you by your health care provider. Make sure you discuss any questions you have with your health care provider. Document Released: 01/04/2004 Document Revised: 09/04/2017 Document Reviewed: 09/04/2017 Elsevier Patient Education  2020 [employer].

## 2018-12-16 NOTE — Progress Notes (Signed)
HPI:      [name] is a 53 y.o. G4P4 who LMP was Patient's last menstrual period was 10/31/2018., presents today for a problem visit.  She complains of oligomenorrhea that  began several months ago and its severity is described as mildly concerning.  She has regular periods every 28 days and they are associated with no menstrual cramping.  She has used the following for attempts at control: thin pad.  Then, she missed a period in Aug, lighter period than usual for 5 days in Sept, and missed again in Oct and Nov.  No hot flashes or sweats.  No hormones, no BC.  Recent FSH and LH elevated.  Reports prior h/o fibroid  Additional sx's this year include light headedness. Prior work up ruled out anemia, thyroid abnormalities.  Previous evaluation: office visit on Oct w PCP. Prior Diagnosis: perimenopausal bleeding. Previous Treatment: none. PAP UTD 2018  She is has sex with males.  Hx of STDs: none. She is perimenopausal.  PMHx: She  has a past medical history of Anemia, Family history of lung cancer, Family history of stomach cancer, Family history of thyroid cancer, Family history of uterine cancer, Fibroids (2015), Herpes simplex, History of chicken pox, and Ovarian cyst. Also,  has a past surgical history that includes LEEP (2007) and screning mammogram (09/2012)., family history includes Anemia in her mother; Cancer (age of onset: 67) in her maternal uncle and paternal grandmother; Diabetes in her mother; Hyperlipidemia in her mother; Hypertension in her mother; Hypothyroidism in her sister and sister; Lung cancer (age of onset: 37) in her father; Melanoma (age of onset: 59) in her sister; Melanoma (age of onset: 2) in her maternal grandfather; Stomach cancer (age of onset: 72) in an other family member; Stomach cancer (age of onset: 47) in an other family member; Thyroid cancer (age of onset: 71) in her sister; Uterine cancer (age of onset: 28) in her maternal aunt.,  reports that she has  never smoked. She has never used smokeless tobacco. She reports that she does not drink alcohol or use drugs.  She  Current Outpatient Medications:  .  POLY-IRON 150 FORTE 150-25-1 MG-MCG-MG CAPS, Take 1 capsule by mouth 2 (two) times daily., Disp: 100 each, Rfl: 5 .  RABEprazole (ACIPHEX) 20 MG tablet, TAKE 1 TABLET BY MOUTH EVERY DAY, Disp: 90 tablet, Rfl: 4  Also, is allergic to prevacid  [lansoprazole].  Review of Systems  Constitutional: Negative for chills, fever and malaise/fatigue.  HENT: Negative for congestion, sinus pain and sore throat.   Eyes: Negative for blurred vision and pain.  Respiratory: Negative for cough and wheezing.   Cardiovascular: Negative for chest pain and leg swelling.  Gastrointestinal: Negative for abdominal pain, constipation, diarrhea, heartburn, nausea and vomiting.  Genitourinary: Negative for dysuria, frequency, hematuria and urgency.  Musculoskeletal: Negative for back pain, joint pain, myalgias and neck pain.  Skin: Negative for itching and rash.  Neurological: Positive for dizziness and tingling. Negative for tremors and weakness.  Endo/Heme/Allergies: Does not bruise/bleed easily.  Psychiatric/Behavioral: Negative for depression. The patient is not nervous/anxious and does not have insomnia.     Objective: BP 130/80   Ht 5\' 4"  (1.626 m)   Wt 184 lb (83.5 kg)   LMP 10/31/2018   BMI 31.58 kg/m  Physical Exam Constitutional:      General: She is not in acute distress.    Appearance: She is well-developed.  Musculoskeletal: Normal range of motion.  Neurological:  Mental Status: She is alert and oriented to person, place, and time.  Skin:    General: Skin is warm and dry.  Vitals signs reviewed.     ASSESSMENT/PLAN:  dysfunctional uterine bleeding and perimenopausal bleeding  Problem List Items Addressed This Visit      Genitourinary   Dysfunctional uterine bleeding - Primary   Relevant Orders   US PELVIC COMPLETE WITH  TRANSVAGINAL    Other Visit Diagnoses    Perimenopausal       Encounter for screening mammogram for malignant neoplasm of breast       Relevant Orders   MM 3D SCREEN BREAST BILATERAL    Discussed this is all normal transition sx's of periods w perimenopause.  Will not define menopause by labs but by >1 year without having a period, so not yet.  Will assess fibroid by [place].  Base tx on sx's.  Info provided to the pt.  A total of 30 minutes were spent face-to-face with the patient during this encounter and over half of that time dealt with counseling and coordination of care.  Barnett Applebaum, MD, [facility], [facility] 12/16/2018  9:06 AM

## 2018-12-23 ENCOUNTER — Telehealth: Payer: Self-pay

## 2018-12-23 NOTE — Telephone Encounter (Signed)
Pt has experienced heavy bleeding since 1 am that has continued through out the day. She typically has heavy bleeding the second day of her menses, but not like this. She's has had some mild pain in the lower right part of her abdomen. Concerned if she may have had a cyst that ruptured. Has low ferritin & is anemic. Cb#[phone]

## 2018-12-23 NOTE — Telephone Encounter (Signed)
Will address at Fridays appt w [place]

## 2018-12-23 NOTE — Telephone Encounter (Signed)
Spoke w/pt. She didn't have period in August. Only had a 5 day cycle in September and skipped period in October. States the bleeding has calmed down. She believes the blood is just pooling while she is sitting and then it falls out when she goes to the toilet possibly appearing to be more than it is. Advised on heavy bleeding protocol & when to report to ED if completely soaking regular size pad and having to change more than 1xqhr. Also, if pain is doubling her over and ibuprofen not controlling. Pt aware [facility] on call. She is scheduled for u/s on Friday as f/u from last weeks visit. States she had fibroids in past as well. Please advise if patient should keep u/s apt and if any labs are needed to check iron now that she is bleeding heavily.

## 2018-12-24 NOTE — Telephone Encounter (Signed)
Unable to reach patient. Voicemail full

## 2018-12-24 NOTE — Telephone Encounter (Signed)
Pt aware.

## 2018-12-26 ENCOUNTER — Encounter: Payer: Self-pay | Admitting: Obstetrics & Gynecology

## 2018-12-26 ENCOUNTER — Other Ambulatory Visit: Payer: Self-pay

## 2018-12-26 ENCOUNTER — Ambulatory Visit (INDEPENDENT_AMBULATORY_CARE_PROVIDER_SITE_OTHER): Payer: Managed Care, Other (non HMO)

## 2018-12-26 ENCOUNTER — Ambulatory Visit (INDEPENDENT_AMBULATORY_CARE_PROVIDER_SITE_OTHER): Payer: Managed Care, Other (non HMO) | Admitting: Obstetrics & Gynecology

## 2018-12-26 VITALS — BP 120/80 | Ht 64.0 in | Wt 182.0 lb

## 2018-12-26 DIAGNOSIS — N938 Other specified abnormal uterine and vaginal bleeding: Secondary | ICD-10-CM

## 2018-12-26 DIAGNOSIS — D252 Subserosal leiomyoma of uterus: Secondary | ICD-10-CM

## 2018-12-26 NOTE — Progress Notes (Signed)
HPI: Pt was having oligomenorrhea.  She has known fibroid. Then this week she had severe heavy and painful bleed for 2 days then tapering off.  This was moree aggressive than most periods she has been having.  Labs suggest perimenopause.  No hot flashes or night sweats.    Ultrasound demonstrates fibroid 2 cm.  PMHx: She  has a past medical history of Anemia, Family history of lung cancer, Family history of stomach cancer, Family history of thyroid cancer, Family history of uterine cancer, Fibroids (2015), Herpes simplex, History of chicken pox, and Ovarian cyst. Also,  has a past surgical history that includes LEEP (2007) and screning mammogram (09/2012)., family history includes Anemia in her mother; Cancer (age of onset: 86) in her maternal uncle and paternal grandmother; Diabetes in her mother; Hyperlipidemia in her mother; Hypertension in her mother; Hypothyroidism in her sister and sister; Lung cancer (age of onset: 69) in her father; Melanoma (age of onset: 35) in her sister; Melanoma (age of onset: 68) in her maternal grandfather; Stomach cancer (age of onset: 9) in an other family member; Stomach cancer (age of onset: 61) in an other family member; Thyroid cancer (age of onset: 23) in her sister; Uterine cancer (age of onset: 36) in her maternal aunt.,  reports that she has never smoked. She has never used smokeless tobacco. She reports that she does not drink alcohol or use drugs.  She has a current medication list which includes the following prescription(s): poly-iron 150 forte and rabeprazole. Also, is allergic to prevacid  [lansoprazole].  Review of Systems  All other systems reviewed and are negative.   Objective: BP 120/80   Ht 5\' 4"  (1.626 m)   Wt 182 lb (82.6 kg)   BMI 31.24 kg/m   Physical examination Constitutional NAD, Conversant  Skin No rashes, lesions or ulceration.   Extremities: Moves all appropriately.  Normal ROM for age. No lymphadenopathy.  Neuro: Grossly  intact  Psych: Oriented to PPT.  Normal mood. Normal affect.   US Pelvic Complete With Transvaginal  Result Date: 12/26/2018 Patient Name: Ariel Lawson DOB: 1965-09-03 MRN: [id number] ULTRASOUND REPORT Location: [facility] Date of Service: 12/26/2018 Indications:Abnormal Uterine Bleeding Findings: The uterus is anteverted and measures 11.5 x 8.6 x 7.4 cm. Echo texture is heterogenous with evidence of focal masses. Within the uterus are multiple suspected fibroids measuring: Fibroid 1: 20.7 x 16.1 x 20.2 mm subserosal anterior right The Endometrium measures 9.2 mm. Right Ovary measures 4.3 x 4.0 x 2.8 cm. It is normal in appearance. There is a dominant follicle in the right ovary measuring 26.5 x 19.4 x 23.0 mm Left Ovary measures 2.7 x 2.7 x 2.1 cm. It is normal in appearance. Survey of the adnexa demonstrates no adnexal masses. There is no free fluid in the cul de sac. Impression: 1. There is one subserosal fibroid seen in the uterus. 2. The uterus is slightly enlarged and heterogeneous. 3. Normal appearing ovaries. Recommendations: 1.Clinical correlation with the patient's History and Physical Exam. Gweneth Dimitri, RT Review of ULTRASOUND.    I have personally reviewed images and report of recent ultrasound done at [facility]    Plan of management to be discussed with patient. Barnett Applebaum, MD, [facility], [facility] 12/26/2018  10:43 AM    Assessment:  Dysfunctional uterine bleeding - Plan: Hemoglobin [place] discussed, fibroid present but not apparantly the cause of her painful period this week.  Will cont to chart and monitor bleeding. Consider Provera  if prolonged episode occurs Consider surgery for fibroid if right sided pain (side fibroid is on) or persistence of severe BTB occurs.  A total of 15 minutes were spent face-to-face with the patient during this encounter and over half of that time dealt with counseling and coordination of care.  Barnett Applebaum, MD, [facility], [facility] 12/26/2018  11:02 AM

## 2018-12-27 LAB — HEMOGLOBIN: Hemoglobin: 12.4 g/dL (ref 11.1–15.9)

## 2019-03-16 ENCOUNTER — Other Ambulatory Visit: Payer: Self-pay | Admitting: Obstetrics & Gynecology

## 2019-03-17 NOTE — Telephone Encounter (Signed)
Please advise routing to [place]

## 2019-04-17 ENCOUNTER — Other Ambulatory Visit: Payer: Self-pay | Admitting: Family Medicine

## 2019-04-27 ENCOUNTER — Encounter: Payer: Self-pay | Admitting: Family Medicine

## 2019-05-13 ENCOUNTER — Telehealth: Payer: Self-pay

## 2019-05-13 NOTE — Telephone Encounter (Signed)
Called pt to remind her to schedule her mammogram. Pt states she will schedule .

## 2019-05-13 NOTE — Telephone Encounter (Signed)
-----   Message from Gae Dry, MD sent at 05/12/2019  4:15 PM EDT ----- Regarding: MMG Received notice she has not received MMG yet as ordered at her Annual. Please check and encourage her to do this, and document conversation.

## 2019-07-10 ENCOUNTER — Encounter: Payer: Self-pay | Admitting: Family Medicine

## 2019-07-10 ENCOUNTER — Ambulatory Visit (INDEPENDENT_AMBULATORY_CARE_PROVIDER_SITE_OTHER): Payer: Managed Care, Other (non HMO) | Admitting: Family Medicine

## 2019-07-10 ENCOUNTER — Other Ambulatory Visit: Payer: Self-pay

## 2019-07-10 VITALS — BP 115/74 | HR 83 | Temp 96.8°F | Resp 16 | Ht 64.0 in | Wt 172.0 lb

## 2019-07-10 DIAGNOSIS — K219 Gastro-esophageal reflux disease without esophagitis: Secondary | ICD-10-CM

## 2019-07-10 DIAGNOSIS — Z808 Family history of malignant neoplasm of other organs or systems: Secondary | ICD-10-CM | POA: Diagnosis not present

## 2019-07-10 DIAGNOSIS — Z0000 Encounter for general adult medical examination without abnormal findings: Secondary | ICD-10-CM

## 2019-07-10 DIAGNOSIS — Z1211 Encounter for screening for malignant neoplasm of colon: Secondary | ICD-10-CM

## 2019-07-10 DIAGNOSIS — D509 Iron deficiency anemia, unspecified: Secondary | ICD-10-CM

## 2019-07-10 DIAGNOSIS — E559 Vitamin D deficiency, unspecified: Secondary | ICD-10-CM

## 2019-07-10 NOTE — Patient Instructions (Addendum)
Please call the [facility] at [facility] at [phone] to schedule your mammogram.   You are due for a Tdap (tetanus-diptheria-pertussis vaccine) which protects you from tetanus and whooping cough. Please check with your insurance plan or pharmacy regarding coverage for this vaccine.     The presence of Covid-19 antibodies is not an indication of immunity to Covid. People with Covid antibodies can still contract Covid and transmit Covid to other people.    The CDC recommends two doses of Shingrix (the shingles vaccine) separated by 2 to 6 months for adults age 53 years and older. I recommend checking with your pharmacy plan regarding coverage for this vaccine.      Preventive Care 84-53 Years Old, Female Preventive care refers to visits with your health care provider and lifestyle choices that can promote health and wellness. This includes:  A yearly physical exam. This may also be called an annual well check.  Regular dental visits and eye exams.  Immunizations.  Screening for certain conditions.  Healthy lifestyle choices, such as eating a healthy diet, getting regular exercise, not using drugs or products that contain nicotine and tobacco, and limiting alcohol use. What can I expect for my preventive care visit? Physical exam Your health care provider will check your:  Height and weight. This may be used to calculate body mass index (BMI), which tells if you are at a healthy weight.  Heart rate and blood pressure.  Skin for abnormal spots. Counseling Your health care provider may ask you questions about your:  Alcohol, tobacco, and drug use.  Emotional well-being.  Home and relationship well-being.  Sexual activity.  Eating habits.  Work and work Statistician.  Method of birth control.  Menstrual cycle.  Pregnancy history. What immunizations do I need?  Influenza (flu) vaccine  This is recommended every  year. Tetanus, diphtheria, and pertussis (Tdap) vaccine  You may need a Td booster every 10 years. Varicella (chickenpox) vaccine  You may need this if you have not been vaccinated. Zoster (shingles) vaccine  You may need this after age 35. Measles, mumps, and rubella (MMR) vaccine  You may need at least one dose of MMR if you were born in 1957 or later. You may also need a second dose. Pneumococcal conjugate (PCV13) vaccine  You may need this if you have certain conditions and were not previously vaccinated. Pneumococcal polysaccharide (PPSV23) vaccine  You may need one or two doses if you smoke cigarettes or if you have certain conditions. Meningococcal conjugate (MenACWY) vaccine  You may need this if you have certain conditions. Hepatitis A vaccine  You may need this if you have certain conditions or if you travel or work in places where you may be exposed to hepatitis A. Hepatitis B vaccine  You may need this if you have certain conditions or if you travel or work in places where you may be exposed to hepatitis B. Haemophilus influenzae type b (Hib) vaccine  You may need this if you have certain conditions. Human papillomavirus (HPV) vaccine  If recommended by your health care provider, you may need three doses over 6 months. You may receive vaccines as individual doses or as more than one vaccine together in one shot (combination vaccines). Talk with your health care provider about the risks and benefits of combination vaccines. What tests do I need? Blood tests  Lipid and cholesterol levels. These may be checked every 5 years, or more frequently if you are  over 53 years old.  Hepatitis C test.  Hepatitis B test. Screening  Lung cancer screening. You may have this screening every year starting at age 40 if you have a 30-pack-year history of smoking and currently smoke or have quit within the past 15 years.  Colorectal cancer screening. All adults should have this  screening starting at age 53 and continuing until age 5. Your health care provider may recommend screening at age 53 if you you are at increased risk. You will have tests every 1-10 years, depending on your results and the type of screening test.  Diabetes screening. This is done by checking your blood sugar (glucose) after you have not eaten for a while (fasting). You may have this done every 1-3 years.  Mammogram. This may be done every 1-2 years. Talk with your health care provider about when you should start having regular mammograms. This may depend on whether you have a family history of breast cancer.  BRCA-related cancer screening. This may be done if you have a family history of breast, ovarian, tubal, or peritoneal cancers.  Pelvic exam and Pap test. This may be done every 3 years starting at age 60. Starting at age 81, this may be done every 5 years if you have a Pap test in combination with an HPV test. Other tests  Sexually transmitted disease (STD) testing.  Bone density scan. This is done to screen for osteoporosis. You may have this scan if you are at high risk for osteoporosis. Follow these instructions at home: Eating and drinking  Eat a diet that includes fresh fruits and vegetables, whole grains, lean protein, and low-fat dairy.  Take vitamin and mineral supplements as recommended by your health care provider.  Do not drink alcohol if: ? Your health care provider tells you not to drink. ? You are pregnant, may be pregnant, or are planning to become pregnant.  If you drink alcohol: ? Limit how much you have to 0-1 drink a day. ? Be aware of how much alcohol is in your drink. In the [place] one drink equals one 12 oz bottle of beer (355 mL), one 5 oz glass of wine (148 mL), or one 1 oz glass of hard liquor (44 mL). Lifestyle  Take daily care of your teeth and gums.  Stay active. Exercise for at least 30 minutes on 5 or more days each week.  Do not use any products that  contain nicotine or tobacco, such as cigarettes, e-cigarettes, and chewing tobacco. If you need help quitting, ask your health care provider.  If you are sexually active, practice safe sex. Use a condom or other form of birth control (contraception) in order to prevent pregnancy and STIs (sexually transmitted infections).  If told by your health care provider, take low-dose aspirin daily starting at age 30. What's next?  Visit your health care provider once a year for a well check visit.  Ask your health care provider how often you should have your eyes and teeth checked.  Stay up to date on all vaccines. This information is not intended to replace advice given to you by your health care provider. Make sure you discuss any questions you have with your health care provider. Document Revised: 10/03/2017 Document Reviewed: 10/03/2017 Elsevier Patient Education  2020 [employer].

## 2019-07-10 NOTE — Progress Notes (Signed)
I,Ariel Lawson,acting as a scribe for Lelon Huh, MD.,have documented all relevant documentation on the behalf of Lelon Huh, MD,as directed by  Lelon Huh, MD while in the presence of Lelon Huh, MD.  Complete physical exam   Patient: Ariel Lawson   DOB: [birth year]   54 y.o. Female  MRN: [id number] Visit Date: 07/10/2019  Today's healthcare provider: Lelon Huh, MD   Chief Complaint  Patient presents with  . Annual Exam  . Anemia   Subjective    Ariel Lawson is a 54 y.o. female who presents today for a complete physical exam.  She reports consuming a gluten free diet diet. Home exercise routine includes walking 2 times a week. She generally feels fairly well. She reports sleeping fairly well. She does not have additional problems to discuss today. She is followed by Dr. Kenton Lawson for routine gyn and breast exams.  HPI  Follow up for Iron Deficiency Anemia:  The patient was last seen for this 7 months ago. Changes made at last visit include none; patient was advised to continue taking iron supplements.  She reports good compliance with treatment. She feels that condition is Unchanged. She is not having side effects.   -----------------------------------------------------------------------------------------  Vitamin D deficiency, follow-up  Lab Results  Component Value Date   VD25OH 32.8 07/03/2018   VD25OH 27.1 (L) 06/13/2017   VD25OH 29.7 (L) 03/29/2016   CALCIUM 9.2 11/18/2018   CALCIUM 9.3 07/03/2018  ) Wt Readings from Last 3 Encounters:  07/10/19 172 lb (78 kg)  12/26/18 182 lb (82.6 kg)  12/16/18 184 lb (83.5 kg)    She was last seen for vitamin D deficiency 1 years ago.  Management since that visit includes no changes. She reports good compliance with treatment. Patient is not currently taking Vitamin D supplements.  She is not having side effects.   Symptoms: No change in energy level No numbness or tingling  No bone pain No  unexplained fracture   ---------------------------------------------------------------------------------------------------   Past Medical History:  Diagnosis Date  . Anemia    IDA  . Family history of lung cancer   . Family history of stomach cancer   . Family history of thyroid cancer   . Family history of uterine cancer   . Fibroids 2015   uterus- cyst  . Herpes simplex   . History of chicken pox   . Ovarian cyst    right   Past Surgical History:  Procedure Laterality Date  . LEEP  2007   Leep cone Biopsy of Cervix  . screning mammogram  09/2012   ordered through [place] employer per pt report   Social History   Socioeconomic History  . Marital status: Married    Spouse name: Not on file  . Number of children: 4  . Years of education: Not on file  . Highest education level: Not on file  Occupational History  . Not on file  Tobacco Use  . Smoking status: Never Smoker  . Smokeless tobacco: Never Used  Substance and Sexual Activity  . Alcohol use: No    Alcohol/week: 0.0 standard drinks  . Drug use: No  . Sexual activity: Yes  Other Topics Concern  . Not on file  Social History Narrative  . Not on file   Social Determinants of Health   Financial Resource Strain:   . Difficulty of Paying Living Expenses:   Food Insecurity:   . Worried About Charity fundraiser in the Last Year:   .  Ran Out of Food in the Last Year:   Transportation Needs:   . Film/video editor (Medical):   Marland Kitchen Lack of Transportation (Non-Medical):   Physical Activity:   . Days of Exercise per Week:   . Minutes of Exercise per Session:   Stress:   . Feeling of Stress :   Social Connections:   . Frequency of Communication with Friends and Family:   . Frequency of Social Gatherings with Friends and Family:   . Attends Religious Services:   . Active Member of Clubs or Organizations:   . Attends Archivist Meetings:   Marland Kitchen Marital Status:   Intimate Partner Violence:   . Fear  of Current or Ex-Partner:   . Emotionally Abused:   Marland Kitchen Physically Abused:   . Sexually Abused:    Family Status  Relation Name Status  . Mother  Alive       Myxedema  . Father  Deceased       lung cancer  . Sister  Alive  . Sister  Alive  . Mat Aunt  Deceased  . Mat Uncle  Deceased  . MGF  (Not Specified)  . PGM  (Not Specified)  . Other [place] Deceased  . Other [place] Deceased   Family History  Problem Relation Age of Onset  . Hypertension Mother   . Diabetes Mother        type 2  . Hyperlipidemia Mother   . Anemia Mother   . Lung cancer Father 29  . Melanoma Sister 46       in remission x 8 years.   . Hypothyroidism Sister   . Thyroid cancer Sister 81  . Hypothyroidism Sister   . Uterine cancer Maternal Aunt 26  . Cancer Maternal Uncle 24       'very rare type of cancer' pt does not know the name of the cancer type  . Melanoma Maternal Grandfather 18  . Cancer Paternal Grandmother 52       mouth  . Stomach cancer Other 42  . Stomach cancer Other 80   Allergies  Allergen Reactions  . Prevacid  [Lansoprazole] Itching    Patient Care Team: Birdie Sons, MD as PCP - General (Family Medicine) Brendolyn Patty, MD (Dermatology) Tiajuana Amass, MD as Referring Physician (Allergy and Immunology)   Medications: Outpatient Medications Prior to Visit  Medication Sig  . POLY-IRON 150 FORTE 150-25-1 MG-MCG-MG CAPS Take 1 capsule by mouth 2 (two) times daily.  . RABEprazole (ACIPHEX) 20 MG tablet TAKE 1 TABLET BY MOUTH EVERY DAY   No facility-administered medications prior to visit.    Review of Systems   Objective    BP 115/74 (BP Location: Left Arm, Patient Position: Sitting, Cuff Size: Large)   Pulse 83   Temp (!) 96.8 F (36 C) (Temporal)   Resp 16   Ht 5\' 4"  (1.626 m)   Wt 172 lb (78 kg)   BMI 29.52 kg/m   Physical Exam   General Appearance:     Overweight female. Alert, cooperative, in no acute distress, appears stated age   Head:    Normocephalic,  without obvious abnormality, atraumatic  Eyes:    PERRL, conjunctiva/corneas clear, EOM's intact, fundi    benign, both eyes  Ears:    Normal TM's and external ear canals, both ears  Neck:   Supple, symmetrical, trachea midline, no adenopathy;    thyroid:  no enlargement/tenderness/nodules; no carotid   bruit or JVD  Back:  Symmetric, no curvature, ROM normal, no CVA tenderness  Lungs:     Clear to auscultation bilaterally, respirations unlabored  Chest Wall:    No tenderness or deformity   Heart:    Normal heart rate. Normal rhythm. No murmurs, rubs, or gallops.   Breast Exam:    deferred to gyn  Abdomen:     Soft, non-tender, bowel sounds active all four quadrants,    no masses, no organomegaly  Pelvic:    deferred  Extremities:   All extremities are intact. No cyanosis or edema  Pulses:   2+ and symmetric all extremities  Skin:   Skin color, texture, turgor normal, no rashes or lesions  Lymph nodes:   Cervical, supraclavicular, and axillary nodes normal  Neurologic:   CNII-XII intact, normal strength, sensation and reflexes    throughout     Depression Screen  PHQ 2/9 Scores 07/10/2019 07/01/2018 06/10/2017  PHQ - 2 Score 0 0 0  PHQ- 9 Score - 0 1    No results found for any visits on 07/10/19.  Assessment & Plan    Routine Health Maintenance and Physical Exam  Exercise Activities and Dietary recommendations Goals   None      There is no immunization history on file for this patient.  Health Maintenance  Topic Date Due  . COVID-19 Vaccine (1) Never done  . TETANUS/TDAP  Never done  . MAMMOGRAM  Never done  . COLONOSCOPY  Never done  . PAP SMEAR-Modifier  04/23/2021  . HIV Screening  Completed    Discussed health benefits of physical activity, and encouraged her to engage in regular exercise appropriate for her age and condition.  1. Annual physical exam She requests covid antibiotic testing. She was extensively counseled that the presence of covid antibiotic  are not a reliable indicator of immunity and should always wear mask in public regardless of results unless she gets vaccinated. She expresses understanding but still request ab testing.  - SAR CoV2 Serology (COVID 19)AB(IGG)IA - Lipid panel - Basic metabolic panel - CBC - Ferritin - Magnesium  2. Colon cancer screening  - Cologuard  3. Gastroesophageal reflux disease, unspecified whether esophagitis present Fairly well controlled on PPI - Magnesium  4. Family history of thyroid cancer  - T4, free - TSH  5. Iron deficiency anemia, unspecified iron deficiency anemia type  - CBC - Ferritin  6. Vitamin D deficiency  - VITAMIN D 25 Hydroxy (Vit-D Deficiency, Fractures)     The entirety of the information documented in the History of Present Illness, Review of Systems and Physical Exam were personally obtained by me. Portions of this information were initially documented by the CMA and reviewed by me for thoroughness and accuracy.      Lelon Huh, MD  [facility] [phone] (phone) [phone] (fax)  [place]

## 2019-07-11 LAB — CBC
Hematocrit: 38.3 % (ref 34.0–46.6)
Hemoglobin: 12.4 g/dL (ref 11.1–15.9)
MCH: 28.7 pg (ref 26.6–33.0)
MCHC: 32.4 g/dL (ref 31.5–35.7)
MCV: 89 fL (ref 79–97)
Platelets: 260 10*3/uL (ref 150–450)
RBC: 4.32 x10E6/uL (ref 3.77–5.28)
RDW: 12.7 % (ref 11.7–15.4)
WBC: 8.6 10*3/uL (ref 3.4–10.8)

## 2019-07-11 LAB — LIPID PANEL
Chol/HDL Ratio: 3.2 ratio (ref 0.0–4.4)
Cholesterol, Total: 184 mg/dL (ref 100–199)
HDL: 58 mg/dL (ref >39)
LDL Chol Calc (NIH): 112 mg/dL — ABNORMAL HIGH (ref 0–99)
Triglycerides: 76 mg/dL (ref 0–149)
VLDL Cholesterol Cal: 14 mg/dL (ref 5–40)

## 2019-07-11 LAB — BASIC METABOLIC PANEL
BUN/Creatinine Ratio: 19 (ref 9–23)
BUN: 15 mg/dL (ref 6–24)
CO2: 25 mmol/L (ref 20–29)
Calcium: 9.8 mg/dL (ref 8.7–10.2)
Chloride: 101 mmol/L (ref 96–106)
Creatinine, Ser: 0.79 mg/dL (ref 0.57–1.00)
GFR calc Af Amer: 100 mL/min/{1.73_m2} (ref >59)
GFR calc non Af Amer: 86 mL/min/{1.73_m2} (ref >59)
Glucose: 96 mg/dL (ref 65–99)
Potassium: 5.3 mmol/L — ABNORMAL HIGH (ref 3.5–5.2)
Sodium: 142 mmol/L (ref 134–144)

## 2019-07-11 LAB — VITAMIN D 25 HYDROXY (VIT D DEFICIENCY, FRACTURES): Vit D, 25-Hydroxy: 33.9 ng/mL (ref 30.0–100.0)

## 2019-07-11 LAB — FERRITIN: Ferritin: 9 ng/mL — ABNORMAL LOW (ref 15–150)

## 2019-07-11 LAB — T4, FREE: Free T4: 1.2 ng/dL (ref 0.82–1.77)

## 2019-07-11 LAB — TSH: TSH: 1.87 u[IU]/mL (ref 0.450–4.500)

## 2019-07-11 LAB — MAGNESIUM: Magnesium: 2.1 mg/dL (ref 1.6–2.3)

## 2019-07-11 LAB — SAR COV2 SEROLOGY (COVID19)AB(IGG),IA: DiaSorin SARS-CoV-2 Ab, IgG: NEGATIVE

## 2019-07-20 ENCOUNTER — Other Ambulatory Visit: Payer: Self-pay | Admitting: Family Medicine

## 2019-07-31 ENCOUNTER — Ambulatory Visit
Admission: RE | Admit: 2019-07-31 | Discharge: 2019-07-31 | Disposition: A | Discharge status: Home / Self Care | Payer: Managed Care, Other (non HMO) | Source: Ambulatory Visit | Attending: Obstetrics & Gynecology | Admitting: Obstetrics & Gynecology

## 2019-07-31 DIAGNOSIS — Z1231 Encounter for screening mammogram for malignant neoplasm of breast: Secondary | ICD-10-CM

## 2019-08-10 LAB — COLOGUARD: Cologuard: NEGATIVE

## 2019-08-14 LAB — COLOGUARD: COLOGUARD: NEGATIVE

## 2019-08-17 ENCOUNTER — Telehealth: Payer: Self-pay

## 2019-08-17 NOTE — Telephone Encounter (Signed)
Patient's cologuard test came back negative. This was entered in EMR. Hard copy placed to be scanned. FYI.

## 2020-01-24 ENCOUNTER — Other Ambulatory Visit: Payer: Self-pay | Admitting: Family Medicine

## 2020-02-16 ENCOUNTER — Other Ambulatory Visit: Payer: Self-pay

## 2020-02-16 ENCOUNTER — Encounter: Payer: Self-pay | Admitting: Family Medicine

## 2020-02-16 ENCOUNTER — Ambulatory Visit (INDEPENDENT_AMBULATORY_CARE_PROVIDER_SITE_OTHER): Payer: Managed Care, Other (non HMO) | Admitting: Family Medicine

## 2020-02-16 VITALS — BP 118/72 | HR 84 | Temp 98.6°F | Resp 16 | Wt 176.0 lb

## 2020-02-16 DIAGNOSIS — R002 Palpitations: Secondary | ICD-10-CM | POA: Diagnosis not present

## 2020-02-16 NOTE — Patient Instructions (Addendum)
.   Start taking OTC magnesium oxide 500mg  once or twice every day   Start taking OTC vitamin D3 2,000 IU every day

## 2020-02-16 NOTE — Progress Notes (Signed)
      Established patient visit   Patient: Ariel Lawson   DOB: [birth year]   55 y.o. Female  MRN: [id number] Visit Date: 02/16/2020  Today's healthcare provider: Lelon Huh, MD   Chief Complaint  Patient presents with  . Palpitations   Subjective    Palpitations  This is a recurrent problem. The current episode started in the past 7 days. The problem has been waxing and waning. The symptoms are aggravated by unknown. Associated symptoms include anxiety and an irregular heartbeat. Pertinent negatives include no chest pain, coughing, dizziness, malaise/fatigue, nausea, near-syncope, shortness of breath, syncope, vomiting or weakness. She has tried nothing for the symptoms. There are no known risk factors.    Patient reports that she has had this before in the past. She had a cardio work up in 2019, and reports that everything was normal.      Medications: Outpatient Medications Prior to Visit  Medication Sig  . POLY-IRON 150 FORTE 150-25-1 MG-MCG-MG CAPS Take 1 capsule by mouth 2 (two) times daily.  . RABEprazole (ACIPHEX) 20 MG tablet TAKE 1 TABLET BY MOUTH EVERY DAY   No facility-administered medications prior to visit.    Review of Systems  Constitutional: Negative for malaise/fatigue.  Respiratory: Negative for cough and shortness of breath.   Cardiovascular: Positive for palpitations. Negative for chest pain, syncope and near-syncope.  Gastrointestinal: Negative for nausea and vomiting.  Neurological: Negative for dizziness and weakness.  Psychiatric/Behavioral: The patient is nervous/anxious.       Objective    BP 118/72   Pulse 84   Temp 98.6 F (37 C)   Resp 16   Wt 176 lb (79.8 kg)   SpO2 100%   BMI 30.21 kg/m    Physical Exam   General: Appearance:    Obese female in no acute distress  Eyes:    PERRL, conjunctiva/corneas clear, EOM's intact       Lungs:     Clear to auscultation bilaterally, respirations unlabored  Heart:    Normal heart rate.  Normal rhythm. No murmurs, rubs, or gallops.   MS:   All extremities are intact.   Neurologic:   Awake, alert, oriented x 3. No apparent focal neurological           defect.       EKG: NSR  No results found for any visits on 02/16/20.  Assessment & Plan     1. Palpitations Identical to previous episodes which typically occur around this time of year. Prior cardiac monitors have been non-diagnostic. Suspect PACs of PVCs exacerbated by anxiety. She is going to try magnesium supplements which help suppress PVCs. Consider CCB if persists. - EKG 12-Lead  She also requests order for  - CT HEART W/O CONTRAST MEDIA - QUANT EVAL CORONARY CALCIUM   No follow-ups on file.      The entirety of the information documented in the History of Present Illness, Review of Systems and Physical Exam were personally obtained by me. Portions of this information were initially documented by the CMA and reviewed by me for thoroughness and accuracy.      Lelon Huh, MD  [facility] [phone] (phone) [phone] (fax)  [place]

## 2020-03-04 ENCOUNTER — Telehealth: Payer: Self-pay

## 2020-03-04 NOTE — Telephone Encounter (Signed)
Copied from [place] [id number]. Topic: Referral - Status >> Mar 04, 2020 11:31 AM Erick Blinks wrote: [phone] pt is requesting a call back from office, is waiting on referral per last visit 02/16/2020. Please advise

## 2020-03-04 NOTE — Telephone Encounter (Signed)
Please review. Did you want to order the CT scan listed during OV on 02/16/20? It's not showing that it was sent to the referral team.

## 2020-03-04 NOTE — Telephone Encounter (Signed)
Ariel Lawson,  This patient was needed a coronary calcium CT scan done in [place]. Was ordered on 02/16/2019. Is this the right test?

## 2020-03-10 NOTE — Telephone Encounter (Addendum)
I think you are talking about CT cardiac scoring which is [id number]. Ariel Lawson will not do these any longer unless ordered by a [place] doctor.She has been set up at [facility] location) for 03/22/20 at 1:30. Pt has been advised. They have fixed order so if this is not the test you wanted I just need to know.

## 2020-03-21 ENCOUNTER — Other Ambulatory Visit: Payer: Self-pay

## 2020-03-21 ENCOUNTER — Ambulatory Visit: Payer: Managed Care, Other (non HMO) | Admitting: Dermatology

## 2020-03-21 ENCOUNTER — Encounter: Payer: Self-pay | Admitting: Dermatology

## 2020-03-21 DIAGNOSIS — D2272 Melanocytic nevi of left lower limb, including hip: Secondary | ICD-10-CM | POA: Diagnosis not present

## 2020-03-21 DIAGNOSIS — Z1283 Encounter for screening for malignant neoplasm of skin: Secondary | ICD-10-CM | POA: Diagnosis not present

## 2020-03-21 DIAGNOSIS — L708 Other acne: Secondary | ICD-10-CM

## 2020-03-21 DIAGNOSIS — D485 Neoplasm of uncertain behavior of skin: Secondary | ICD-10-CM

## 2020-03-21 DIAGNOSIS — L738 Other specified follicular disorders: Secondary | ICD-10-CM

## 2020-03-21 DIAGNOSIS — L820 Inflamed seborrheic keratosis: Secondary | ICD-10-CM

## 2020-03-21 DIAGNOSIS — L578 Other skin changes due to chronic exposure to nonionizing radiation: Secondary | ICD-10-CM

## 2020-03-21 DIAGNOSIS — Z86018 Personal history of other benign neoplasm: Secondary | ICD-10-CM

## 2020-03-21 DIAGNOSIS — I781 Nevus, non-neoplastic: Secondary | ICD-10-CM

## 2020-03-21 DIAGNOSIS — D239 Other benign neoplasm of skin, unspecified: Secondary | ICD-10-CM

## 2020-03-21 DIAGNOSIS — L814 Other melanin hyperpigmentation: Secondary | ICD-10-CM

## 2020-03-21 DIAGNOSIS — L720 Epidermal cyst: Secondary | ICD-10-CM

## 2020-03-21 DIAGNOSIS — D225 Melanocytic nevi of trunk: Secondary | ICD-10-CM | POA: Diagnosis not present

## 2020-03-21 DIAGNOSIS — D229 Melanocytic nevi, unspecified: Secondary | ICD-10-CM

## 2020-03-21 NOTE — Patient Instructions (Signed)
Cryotherapy Aftercare  . Wash gently with soap and water everyday.   . Apply Vaseline and Band-Aid daily until healed.  Wound Care Instructions  1. Cleanse wound gently with soap and water once a day then pat dry with clean gauze. Apply a thing coat of Petrolatum (petroleum jelly, "Vaseline") over the wound (unless you have an allergy to this). We recommend that you use a new, sterile tube of Vaseline. Do not pick or remove scabs. Do not remove the yellow or white "healing tissue" from the base of the wound.  2. Cover the wound with fresh, clean, nonstick gauze and secure with paper tape. You may use Band-Aids in place of gauze and tape if the would is small enough, but would recommend trimming much of the tape off as there is often too much. Sometimes Band-Aids can irritate the skin.  3. You should call the office for your biopsy report after 1 week if you have not already been contacted.  4. If you experience any problems, such as abnormal amounts of bleeding, swelling, significant bruising, significant pain, or evidence of infection, please call the office immediately.  5. FOR ADULT SURGERY PATIENTS: If you need something for pain relief you may take 1 extra strength Tylenol (acetaminophen) AND 2 Ibuprofen (200mg each) together every 4 hours as needed for pain. (do not take these if you are allergic to them or if you have a reason you should not take them.) Typically, you may only need pain medication for 1 to 3 days.     

## 2020-03-21 NOTE — Progress Notes (Signed)
Follow-Up Visit   Subjective  Ariel Lawson is a 55 y.o. female who presents for the following: Annual Exam.  Patient here today for TBSE. She has a history of Dysplastic Nevi. She would like a spot on the left thigh checked today. It isn't new, but she thinks it may be changing and is itchy. She also has a few spots on her leg, back, and left shoulder that are very itchy and that she picks at.  The following portions of the chart were reviewed this encounter and updated as appropriate:       Review of Systems:  No other skin or systemic complaints except as noted in HPI or Assessment and Plan.  Objective  Well appearing patient in no apparent distress; mood and affect are within normal limits.  A full examination was performed including scalp, head, eyes, ears, nose, lips, neck, chest, axillae, abdomen, back, buttocks, bilateral upper extremities, bilateral lower extremities, hands, feet, fingers, toes, fingernails, and toenails. All findings within normal limits unless otherwise noted below.  Objective  Left hip: 7.4mm brown macule with central fleshy papule       Objective  L anterior thigh x 1, spinal mid back x 4, L ant shoulder x 3 (8): Erythematous keratotic or waxy stuck-on papule, tiny pink brown papules surrounding hypopigmented scar mid back (area of previous cryotherapy)  Objective  Spinal mid back: Dilated pore  Objective  Left Lower Back: 5.6mm regular brown macule   Assessment & Plan  Neoplasm of uncertain behavior of skin Left hip  Epidermal / dermal shaving  Lesion diameter (cm):  1.1 Informed consent: discussed and consent obtained   Patient was prepped and draped in usual sterile fashion: Area prepped with alcohol. Anesthesia: the lesion was anesthetized in a standard fashion   Anesthetic:  1% lidocaine w/ epinephrine 1-100,000 buffered w/ 8.4% NaHCO3 Instrument used: flexible razor blade   Hemostasis achieved with: pressure, aluminum  chloride and electrodesiccation   Outcome: patient tolerated procedure well   Post-procedure details: wound care instructions given   Post-procedure details comment:  Ointment and small bandage applied  Specimen 1 - Surgical pathology Differential Diagnosis: Irritated Nevus r/o Dysplasia Check Margins: Yes 7.38mm brown macule with central fleshy papule  Inflamed seborrheic keratosis (8) L anterior thigh x 1, spinal mid back x 4, L ant shoulder x 3  Residual ISK spinal mid back  Destruction of lesion - L anterior thigh x 1, spinal mid back x 4, L ant shoulder x 3  Destruction method: cryotherapy   Informed consent: discussed and consent obtained   Lesion destroyed using liquid nitrogen: Yes   Region frozen until ice ball extended beyond lesion: Yes   Outcome: patient tolerated procedure well with no complications   Post-procedure details: wound care instructions given    Dilated pore of Winer Spinal mid back  Benign, observe.    Nevus Left Lower Back  Benign-appearing.  Observation.  Call clinic for new or changing moles.  Recommend daily use of broad spectrum spf 30+ sunscreen to sun-exposed areas.    Skin cancer screening performed today.  Actinic Damage - chronic, secondary to cumulative UV radiation exposure/sun exposure over time - diffuse scaly erythematous macules with underlying dyspigmentation - Recommend daily broad spectrum sunscreen SPF 30+ to sun-exposed areas, reapply every 2 hours as needed.  - Call for new or changing lesions.  Melanocytic Nevi - Tan-brown and/or pink-flesh-colored symmetric macules and papules, including occipital scalp - Benign appearing on exam today - Observation -  Call clinic for new or changing moles - Recommend daily use of broad spectrum spf 30+ sunscreen to sun-exposed areas.   Lentigines - Scattered tan macules - Discussed due to sun exposure - Benign, observe - Recommend daily broad spectrum sunscreen SPF 30+ to  sun-exposed areas, reapply every 2 hours as needed. - Call for any changes  Telangiectasia - Dilated blood vessel of the legs - Benign appearing on exam - Call for changes  History of Dysplastic Nevi - No evidence of recurrence today - Recommend regular full body skin exams - Recommend daily broad spectrum sunscreen SPF 30+ to sun-exposed areas, reapply every 2 hours as needed.  - Call if any new or changing lesions are noted between office visits  Milia - tiny firm white papules - type of cyst - benign - may be extracted if symptomatic - observe - may not be covered by insurance  Sebaceous Hyperplasia - Small yellow papules with a central dell - Benign - Observe   Return in about 1 year (around 03/21/2021) for TBSE.   IJamesetta Orleans, CMA, am acting as scribe for Brendolyn Patty, MD .  Documentation: I have reviewed the above documentation for accuracy and completeness, and I agree with the above.  Brendolyn Patty MD

## 2020-03-22 ENCOUNTER — Ambulatory Visit
Admission: RE | Admit: 2020-03-22 | Discharge: 2020-03-22 | Disposition: A | Discharge status: Home / Self Care | Payer: Managed Care, Other (non HMO) | Source: Ambulatory Visit | Attending: Family Medicine | Admitting: Family Medicine

## 2020-03-22 DIAGNOSIS — R002 Palpitations: Secondary | ICD-10-CM | POA: Insufficient documentation

## 2020-03-23 ENCOUNTER — Telehealth: Payer: Self-pay

## 2020-03-23 NOTE — Telephone Encounter (Signed)
Advised pt of bx results/sh ?

## 2020-03-23 NOTE — Telephone Encounter (Signed)
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

## 2020-07-12 ENCOUNTER — Encounter: Payer: Self-pay | Admitting: Family Medicine

## 2020-07-12 ENCOUNTER — Other Ambulatory Visit: Payer: Self-pay

## 2020-07-12 ENCOUNTER — Ambulatory Visit (INDEPENDENT_AMBULATORY_CARE_PROVIDER_SITE_OTHER): Payer: Managed Care, Other (non HMO) | Admitting: Family Medicine

## 2020-07-12 VITALS — BP 115/77 | HR 73 | Temp 98.2°F | Resp 16 | Ht 63.5 in | Wt 168.0 lb

## 2020-07-12 DIAGNOSIS — I491 Atrial premature depolarization: Secondary | ICD-10-CM

## 2020-07-12 DIAGNOSIS — Z0000 Encounter for general adult medical examination without abnormal findings: Secondary | ICD-10-CM | POA: Diagnosis not present

## 2020-07-12 DIAGNOSIS — E559 Vitamin D deficiency, unspecified: Secondary | ICD-10-CM

## 2020-07-12 DIAGNOSIS — Z808 Family history of malignant neoplasm of other organs or systems: Secondary | ICD-10-CM | POA: Diagnosis not present

## 2020-07-12 DIAGNOSIS — Z136 Encounter for screening for cardiovascular disorders: Secondary | ICD-10-CM

## 2020-07-12 DIAGNOSIS — D509 Iron deficiency anemia, unspecified: Secondary | ICD-10-CM

## 2020-07-12 NOTE — Progress Notes (Signed)
Complete physical exam   Patient: Ariel Lawson   DOB: 07/17/1965   55 y.o. Female  MRN: [id number] Visit Date: 07/12/2020  Today's healthcare provider: Lelon Huh, MD   Chief Complaint  Patient presents with  . Annual Exam  . Gastroesophageal Reflux  . Anemia   Subjective    Ariel Lawson is a 55 y.o. female who presents today for a complete physical exam.  She reports consuming a general diet. Home exercise routine includes walking. She generally feels fairly well. She reports sleeping fairly well. She does not have additional problems to discuss today.  HPI  Follow up for GERD:  The patient was last seen for this 1 year ago. Changes made at last visit include none.  She reports good compliance with treatment. Patient has changed to a Gluten free diet and has not had anymore problems with GERD. She feels that condition is Improved. She is not having side effects.   ----------------------------------------------------------------------------------------- Follow up for iron deficiency anemia:  The patient was last seen for this 1 year ago. During that visit labs were checked showing low ferritin level at 9. Advised patient to try geting more healthy iron rich foods.  She reports good compliance with treatment. She feels that condition is Unchanged. She is not having side effects.   -----------------------------------------------------------------------------------------  Vitamin D deficiency, follow-up  Lab Results  Component Value Date   VD25OH 33.9 07/10/2019   VD25OH 32.8 07/03/2018   VD25OH 27.1 (L) 06/13/2017   CALCIUM 9.8 07/10/2019   CALCIUM 9.2 11/18/2018  ) Wt Readings from Last 3 Encounters:  07/12/20 168 lb (76.2 kg)  02/16/20 176 lb (79.8 kg)  07/10/19 172 lb (78 kg)    She was last seen for vitamin D deficiency 1 year ago.  She reports good compliance with treatment. She is not having side effects.   Symptoms: No change in energy  level No numbness or tingling  No bone pain No unexplained fracture   ---------------------------------------------------------------------------------------------------   Past Medical History:  Diagnosis Date  . Anemia    IDA  . Dysplastic nevus 06/17/2018   left paraspinal lower back, mod atypia   . Dysplastic nevus 09/02/2017   right posterior shoulder severe atypia excised   . Dysplastic nevus 09/18/2006   Right ant thigh mod to marked atypia excised   . Dysplastic nevus 03/21/2020   L hip, mild atypia  . Family history of lung cancer   . Family history of stomach cancer   . Family history of thyroid cancer   . Family history of uterine cancer   . Fibroids 2015   uterus- cyst  . Herpes simplex   . History of chicken pox   . Ovarian cyst    right   Past Surgical History:  Procedure Laterality Date  . LEEP  2007   Leep cone Biopsy of Cervix  . screning mammogram  09/2012   ordered through [place] employer per pt report   Social History   Socioeconomic History  . Marital status: Married    Spouse name: Not on file  . Number of children: 4  . Years of education: Not on file  . Highest education level: Not on file  Occupational History  . Not on file  Tobacco Use  . Smoking status: Never Smoker  . Smokeless tobacco: Never Used  Substance and Sexual Activity  . Alcohol use: No    Alcohol/week: 0.0 standard drinks  . Drug use: No  . Sexual activity:  Yes  Other Topics Concern  . Not on file  Social History Narrative  . Not on file   Social Determinants of Health   Financial Resource Strain: Not on file  Food Insecurity: Not on file  Transportation Needs: Not on file  Physical Activity: Not on file  Stress: Not on file  Social Connections: Not on file  Intimate Partner Violence: Not on file   Family Status  Relation Name Status  . Mother  Alive       Myxedema  . Father  Deceased       lung cancer  . Sister  Alive  . Sister  Alive  . Mat Aunt   Deceased  . Mat Uncle  Deceased  . MGF  (Not Specified)  . PGM  (Not Specified)  . Other [place] Deceased  . Other [place] Deceased   Family History  Problem Relation Age of Onset  . Hypertension Mother   . Diabetes Mother        type 2  . Hyperlipidemia Mother   . Anemia Mother   . Lung cancer Father 58  . Melanoma Sister 26       in remission x 8 years.   . Hypothyroidism Sister   . Thyroid cancer Sister 28  . Hypothyroidism Sister   . Uterine cancer Maternal Aunt 26  . Cancer Maternal Uncle 69       'very rare type of cancer' pt does not know the name of the cancer type  . Melanoma Maternal Grandfather 40  . Cancer Paternal Grandmother 22       mouth  . Stomach cancer Other 84  . Stomach cancer Other 80   Allergies  Allergen Reactions  . Prevacid  [Lansoprazole] Itching    Patient Care Team: Birdie Sons, MD as PCP - General (Family Medicine) Brendolyn Patty, MD (Dermatology) Tiajuana Amass, MD as Referring Physician (Allergy and Immunology) Gae Dry, MD as Referring Physician (Obstetrics and Gynecology) Anell Barr, OD (Optometry) Brendolyn Patty, MD (Dermatology)   Medications: Outpatient Medications Prior to Visit  Medication Sig  . Cholecalciferol (VITAMIN D) 50 MCG (2000 UT) CAPS Take 1 capsule by mouth daily.  . Magnesium Gluconate (MAGNESIUM 27 PO) Take 1 tablet by mouth daily.  Marland Kitchen POLY-IRON 150 FORTE 150-25-1 MG-MCG-MG CAPS Take 1 capsule by mouth 2 (two) times daily.   No facility-administered medications prior to visit.    Review of Systems  Constitutional: Negative for chills, fatigue and fever.  HENT: Negative for congestion, ear pain, rhinorrhea, sneezing and sore throat.   Eyes: Negative.  Negative for pain and redness.  Respiratory: Negative for cough, shortness of breath and wheezing.   Cardiovascular: Negative for chest pain and leg swelling.  Gastrointestinal: Negative for abdominal pain, blood in stool, constipation, diarrhea and  nausea.  Endocrine: Negative for polydipsia and polyphagia.  Genitourinary: Negative.  Negative for dysuria, flank pain, hematuria, pelvic pain, vaginal bleeding and vaginal discharge.  Musculoskeletal: Negative for arthralgias, back pain, gait problem and joint swelling.  Skin: Negative for rash.  Neurological: Negative.  Negative for dizziness, tremors, seizures, weakness, light-headedness, numbness and headaches.  Hematological: Negative for adenopathy.  Psychiatric/Behavioral: Negative.  Negative for behavioral problems, confusion and dysphoric mood. The patient is not nervous/anxious and is not hyperactive.       Objective    BP 115/77 (BP Location: Left Arm, Patient Position: Sitting, Cuff Size: Normal)   Pulse 73   Temp 98.2 F (36.8 C) (Temporal)  Resp 16   Ht 5' 3.5" (1.613 m)   Wt 168 lb (76.2 kg)   LMP 07/12/2020 (Exact Date)   BMI 29.29 kg/m    Waist circumference: 34.75 inches   Physical Exam   General Appearance:     Overweight female. Alert, cooperative, in no acute distress, appears stated age   Head:    Normocephalic, without obvious abnormality, atraumatic  Eyes:    PERRL, conjunctiva/corneas clear, EOM's intact, fundi    benign, both eyes  Ears:    Normal TM's and external ear canals, both ears  Neck:   Supple, symmetrical, trachea midline, no adenopathy;    thyroid:  no enlargement/tenderness/nodules; no carotid   bruit or JVD  Back:     Symmetric, no curvature, ROM normal, no CVA tenderness  Lungs:     Clear to auscultation bilaterally, respirations unlabored  Chest Wall:    No tenderness or deformity   Heart:    Normal heart rate. Normal rhythm. No murmurs, rubs, or gallops.   Breast Exam:    normal appearance, no masses or tenderness  Abdomen:     Soft, non-tender, bowel sounds active all four quadrants,    no masses, no organomegaly  Pelvic:    deferred  Extremities:   All extremities are intact. No cyanosis or edema  Pulses:   2+ and symmetric  all extremities  Skin:   Skin color, texture, turgor normal, no rashes or lesions  Lymph nodes:   Cervical, supraclavicular, and axillary nodes normal  Neurologic:   CNII-XII intact, normal strength, sensation and reflexes    throughout    Last depression screening scores PHQ 2/9 Scores 07/12/2020 02/16/2020 07/10/2019  PHQ - 2 Score 0 0 0  PHQ- 9 Score 0 0 -   Last fall risk screening Fall Risk  07/12/2020  Falls in the past year? 0  Number falls in past yr: 0  Injury with Fall? 0  Risk for fall due to : -  Follow up Falls evaluation completed   Last Audit-C alcohol use screening Alcohol Use Disorder Test (AUDIT) 07/12/2020  1. How often do you have a drink containing alcohol? 0  2. How many drinks containing alcohol do you have on a typical day when you are drinking? 0  3. How often do you have six or more drinks on one occasion? 0  AUDIT-C Score 0  Alcohol Brief Interventions/Follow-up -   A score of 3 or more in women, and 4 or more in men indicates increased risk for alcohol abuse, EXCEPT if all of the points are from question 1   No results found for any visits on 07/12/20.  Assessment & Plan    Routine Health Maintenance and Physical Exam  Exercise Activities and Dietary recommendations Goals   None      There is no immunization history on file for this patient.  Health Maintenance  Topic Date Due  . Pneumococcal Vaccine 54-17 Years old (1 of 4 - PCV13) Never done  . COVID-19 Vaccine (1) Never done  . TETANUS/TDAP  Never done  . Zoster Vaccines- Shingrix (1 of 2) Never done  . PAP SMEAR-Modifier  04/23/2021  . MAMMOGRAM  07/30/2021  . Fecal DNA (Cologuard)  08/12/2022  . Hepatitis C Screening  Completed  . HIV Screening  Completed  . HPV VACCINES  Aged Out    Discussed health benefits of physical activity, and encouraged her to engage in regular exercise appropriate for her age and condition.  1. Iron deficiency anemia, unspecified iron deficiency anemia  type  - CBC - Ferritin  2. Annual physical exam  - SARS-CoV-2 Antibodies - Comprehensive metabolic panel - Epstein-Barr virus VCA antibody panel - EPSTEIN-BARR VIRUS (EBV) Antibody Profile  3. Family history of thyroid cancer  - TSH+T4F+T3Free  4. Encounter for special screening examination for cardiovascular disorder  - Lipid panel  5. Vitamin D deficiency  - VITAMIN D 25 Hydroxy (Vit-D Deficiency, Fractures)  6. PAC (premature atrial contraction)  - Magnesium  Recommended she get covid vaccines. Also advised she is due for tetanus and shingles vaccines which she declined.     The entirety of the information documented in the History of Present Illness, Review of Systems and Physical Exam were personally obtained by me. Portions of this information were initially documented by the CMA and reviewed by me for thoroughness and accuracy.      Lelon Huh, MD  [facility] [phone] (phone) [phone] (fax)  [place]

## 2020-07-15 LAB — CBC
Hematocrit: 40.4 % (ref 34.0–46.6)
Hemoglobin: 13.1 g/dL (ref 11.1–15.9)
MCH: 27.3 pg (ref 26.6–33.0)
MCHC: 32.4 g/dL (ref 31.5–35.7)
MCV: 84 fL (ref 79–97)
Platelets: 236 10*3/uL (ref 150–450)
RBC: 4.79 x10E6/uL (ref 3.77–5.28)
RDW: 15.3 % (ref 11.7–15.4)
WBC: 6.4 10*3/uL (ref 3.4–10.8)

## 2020-07-15 LAB — EPSTEIN-BARR VIRUS (EBV) ANTIBODY PROFILE
EBV NA IgG: 148 U/mL — ABNORMAL HIGH (ref 0.0–17.9)
EBV VCA IgG: 52.8 U/mL — ABNORMAL HIGH (ref 0.0–17.9)
EBV VCA IgM: <36 U/mL (ref 0.0–35.9)

## 2020-07-15 LAB — COMPREHENSIVE METABOLIC PANEL
ALT: 10 IU/L (ref 0–32)
AST: 11 IU/L (ref 0–40)
Albumin/Globulin Ratio: 1.5 (ref 1.2–2.2)
Albumin: 4.1 g/dL (ref 3.8–4.9)
Alkaline Phosphatase: 63 IU/L (ref 44–121)
BUN/Creatinine Ratio: 12 (ref 9–23)
BUN: 10 mg/dL (ref 6–24)
Bilirubin Total: 0.4 mg/dL (ref 0.0–1.2)
CO2: 22 mmol/L (ref 20–29)
Calcium: 9.4 mg/dL (ref 8.7–10.2)
Chloride: 104 mmol/L (ref 96–106)
Creatinine, Ser: 0.85 mg/dL (ref 0.57–1.00)
Globulin, Total: 2.7 g/dL (ref 1.5–4.5)
Glucose: 97 mg/dL (ref 65–99)
Potassium: 4.8 mmol/L (ref 3.5–5.2)
Sodium: 140 mmol/L (ref 134–144)
Total Protein: 6.8 g/dL (ref 6.0–8.5)
eGFR: 82 mL/min/{1.73_m2} (ref >59)

## 2020-07-15 LAB — LIPID PANEL
Chol/HDL Ratio: 3.3 ratio (ref 0.0–4.4)
Cholesterol, Total: 176 mg/dL (ref 100–199)
HDL: 53 mg/dL (ref >39)
LDL Chol Calc (NIH): 109 mg/dL — ABNORMAL HIGH (ref 0–99)
Triglycerides: 72 mg/dL (ref 0–149)
VLDL Cholesterol Cal: 14 mg/dL (ref 5–40)

## 2020-07-15 LAB — TSH+T4F+T3FREE
Free T4: 1.38 ng/dL (ref 0.82–1.77)
T3, Free: 3.1 pg/mL (ref 2.0–4.4)
TSH: 1.74 u[IU]/mL (ref 0.450–4.500)

## 2020-07-15 LAB — MAGNESIUM: Magnesium: 2.1 mg/dL (ref 1.6–2.3)

## 2020-07-15 LAB — SARS-COV-2 ANTIBODIES: SARS-CoV-2 Antibodies: POSITIVE

## 2020-07-15 LAB — FERRITIN: Ferritin: 15 ng/mL (ref 15–150)

## 2020-07-15 LAB — VITAMIN D 25 HYDROXY (VIT D DEFICIENCY, FRACTURES): Vit D, 25-Hydroxy: 34 ng/mL (ref 30.0–100.0)

## 2021-02-10 ENCOUNTER — Encounter: Payer: Self-pay | Admitting: Family Medicine

## 2021-02-10 ENCOUNTER — Ambulatory Visit: Payer: Managed Care, Other (non HMO) | Admitting: Family Medicine

## 2021-02-10 ENCOUNTER — Other Ambulatory Visit: Payer: Self-pay

## 2021-02-10 VITALS — BP 130/70 | HR 71 | Temp 98.6°F | Wt 178.0 lb

## 2021-02-10 DIAGNOSIS — F419 Anxiety disorder, unspecified: Secondary | ICD-10-CM

## 2021-02-10 NOTE — Progress Notes (Signed)
°  ° ° °  Established patient visit   Patient: Ariel Lawson   DOB: [birth year]   56 y.o. Female  MRN: [id number] Visit Date: 02/10/2021  Today's healthcare provider: Lelon Huh, MD   Chief Complaint  Patient presents with   Anxiety   Subjective    Anxiety Presents for initial visit. The problem has been gradually worsening. Symptoms include excessive worry, insomnia, nervous/anxious behavior and panic. Patient reports no decreased concentration, depressed mood or suicidal ideas. The quality of sleep is poor. Nighttime awakenings: several.    Pt would like to have FMLA paperwork filled out. 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 She talked to [employer] about intermittent leave to take as needed. She feels it will be very helpful to have the option, but unsure about how often she may need to go. She has been doing several self-help strategies to help manage stress, but is not interested in prescription medications. She did see counselor many years ago which was helpful, but feels she has the tools to manage without a counselor at this time.     Medications: Outpatient Medications Prior to Visit  Medication Sig   Cholecalciferol (VITAMIN D) 50 MCG (2000 UT) CAPS Take 1 capsule by mouth daily.   Magnesium Gluconate (MAGNESIUM 27 PO) Take 1 tablet by mouth daily.   POLY-IRON 150 FORTE 150-25-1 MG-MCG-MG CAPS Take 1 capsule by mouth 2 (two) times daily.   No facility-administered medications prior to visit.    Review of Systems  Constitutional: Negative.   Respiratory: Negative.    Cardiovascular: Negative.   Gastrointestinal: Negative.   Psychiatric/Behavioral:  Positive for sleep disturbance. Negative for decreased concentration, dysphoric mood, self-injury and suicidal ideas. The patient is nervous/anxious and has insomnia.       Objective    BP 130/70 (BP Location: Left Arm, Patient Position:  Sitting, Cuff Size: Large)    Pulse 71    Temp 98.6 F (37 C) (Oral)    Wt 178 lb (80.7 kg)    SpO2 100%    BMI 31.04 kg/m    Physical Exam  General appearance: Overweight female, cooperative and in no acute distress Head: Normocephalic, without obvious abnormality, atraumatic Respiratory: Respirations even and unlabored, normal respiratory rate Extremities: All extremities are intact.  Skin: Skin color, texture, turgor normal. No rashes seen  Psych: Appropriate mood and affect. Neurologic: Mental status: Alert, oriented to person, place, and time, thought content appropriate.    Assessment & Plan     1. Anxiety disorder, unspecified type She has several management strategies that she has employed. But work is very stressful so would like flexibility to take intermittently as needed. Will complete FMLA to take up to 3 days off week. FMLA will be active for six months and she will follow up before it expires if she wishes to have it extended.   35 minutes reviewing her medical record, counseling patient regarding her conditions and coordination of care, reviewing and completely FMLA forms.      The entirety of the information documented in the History of Present Illness, Review of Systems and Physical Exam were personally obtained by me. Portions of this information were initially documented by the CMA and reviewed by me for thoroughness and accuracy.     Lelon Huh, MD  [facility] [phone] (phone) [phone] (fax)  [place]

## 2021-02-23 ENCOUNTER — Telehealth: Payer: Self-pay

## 2021-02-23 NOTE — Telephone Encounter (Signed)
Copied from [place] [phone]. Topic: General - Other >> Feb 23, 2021 11:52 AM Ariel Lawson A wrote: Reason for CRM: The patient has called for an update on previously discussed FMLA paperwork   The patient shares that they requested for the PCP to contact their HR department at their previous visit  The patient would like to know if the paperwork was submitted via fax or email  Please contact further when possible

## 2021-02-24 ENCOUNTER — Encounter: Payer: Self-pay | Admitting: Family Medicine

## 2021-02-24 NOTE — Telephone Encounter (Signed)
See MyChart message.   Thanks,   -Mickel Baas

## 2021-03-27 ENCOUNTER — Ambulatory Visit: Payer: Managed Care, Other (non HMO) | Admitting: Dermatology

## 2021-03-27 ENCOUNTER — Other Ambulatory Visit: Payer: Self-pay

## 2021-03-27 ENCOUNTER — Encounter: Payer: Self-pay | Admitting: Dermatology

## 2021-03-27 DIAGNOSIS — L821 Other seborrheic keratosis: Secondary | ICD-10-CM

## 2021-03-27 DIAGNOSIS — L820 Inflamed seborrheic keratosis: Secondary | ICD-10-CM

## 2021-03-27 DIAGNOSIS — D1800 Hemangioma unspecified site: Secondary | ICD-10-CM

## 2021-03-27 DIAGNOSIS — Z1283 Encounter for screening for malignant neoplasm of skin: Secondary | ICD-10-CM | POA: Diagnosis not present

## 2021-03-27 DIAGNOSIS — L918 Other hypertrophic disorders of the skin: Secondary | ICD-10-CM | POA: Diagnosis not present

## 2021-03-27 DIAGNOSIS — D235 Other benign neoplasm of skin of trunk: Secondary | ICD-10-CM | POA: Diagnosis not present

## 2021-03-27 DIAGNOSIS — Z86018 Personal history of other benign neoplasm: Secondary | ICD-10-CM

## 2021-03-27 DIAGNOSIS — D229 Melanocytic nevi, unspecified: Secondary | ICD-10-CM

## 2021-03-27 DIAGNOSIS — L578 Other skin changes due to chronic exposure to nonionizing radiation: Secondary | ICD-10-CM

## 2021-03-27 DIAGNOSIS — R202 Paresthesia of skin: Secondary | ICD-10-CM

## 2021-03-27 DIAGNOSIS — L738 Other specified follicular disorders: Secondary | ICD-10-CM

## 2021-03-27 DIAGNOSIS — I8393 Asymptomatic varicose veins of bilateral lower extremities: Secondary | ICD-10-CM

## 2021-03-27 DIAGNOSIS — L814 Other melanin hyperpigmentation: Secondary | ICD-10-CM

## 2021-03-27 DIAGNOSIS — D225 Melanocytic nevi of trunk: Secondary | ICD-10-CM

## 2021-03-27 DIAGNOSIS — D239 Other benign neoplasm of skin, unspecified: Secondary | ICD-10-CM

## 2021-03-27 MED ORDER — TRIAMCINOLONE ACETONIDE 0.1 % EX CREA: 1.0000 "application " | TOPICAL_CREAM | Freq: Two times a day (BID) | CUTANEOUS | 2 refills | Status: DC | PRN

## 2021-03-27 NOTE — Patient Instructions (Addendum)
Notalgia paresthetica is a chronic condition affecting the skin of the back in which a pinched nerve along the spine causes itching or changes in sensation in an area of skin. This is usually accompanied by chronic rubbing or scratching. There is no cure, but there are some treatments which may help control the itch.   Over the counter (non-prescription) treatments for notalgia paresthetica include numbing creams like pramoxine or lidocaine which temporarily reduce itch or Capsaicin-containing creams which cause a burning sensation but which sometimes over time will reset the nerves to stop producing itch.   If you choose to use Capsaicin cream, it is recommended to use it 5 times daily for 1 week followed by 3 times daily for 3-6 weeks. You may have to continue using it long-term. For severe cases, there are some prescription cream or pill options which may help.  Recommend OTC Gold Bond Rapid Relief Anti-Itch cream (pramoxine + menthol), CeraVe Anti-itch cream or lotion (pramoxine), Sarna lotion (Original- menthol + camphor or Sensitive- pramoxine) or Eucerin 12 hour Itch Relief lotion (menthol) up to 3 times per day to areas on body that are itchy.  Start Triamcinolone cream twice daily to back as needed for itching. Up to 2 weeks at a time. Avoid applying to face, groin, and axilla. Use as directed. Long-term use can cause thinning of the skin.   Cryotherapy Aftercare  Wash gently with soap and water everyday.   Apply Vaseline and Band-Aid daily until healed.   Prior to procedure, discussed risks of blister formation, small wound, skin dyspigmentation, or rare scar following cryotherapy. Recommend Vaseline ointment to treated areas while healing.   Seborrheic Keratosis  What causes seborrheic keratoses? Seborrheic keratoses are harmless, common skin growths that first appear during adult life.  As time goes by, more growths appear.  Some people may develop a large number of them.  Seborrheic  keratoses appear on both covered and uncovered body parts.  They are not caused by sunlight.  The tendency to develop seborrheic keratoses can be inherited.  They vary in color from skin-colored to gray, brown, or even black.  They can be either smooth or have a rough, warty surface.   Seborrheic keratoses are superficial and look as if they were stuck on the skin.  Under the microscope this type of keratosis looks like layers upon layers of skin.  That is why at times the top layer may seem to fall off, but the rest of the growth remains and re-grows.    Treatment Seborrheic keratoses do not need to be treated, but can easily be removed in the office.  Seborrheic keratoses often cause symptoms when they rub on clothing or jewelry.  Lesions can be in the way of shaving.  If they become inflamed, they can cause itching, soreness, or burning.  Removal of a seborrheic keratosis can be accomplished by freezing, burning, or surgery. If any spot bleeds, scabs, or grows rapidly, please return to have it checked, as these can be an indication of a skin cancer.  Melanoma ABCDEs  Melanoma is the most dangerous type of skin cancer, and is the leading cause of death from skin disease.  You are more likely to develop melanoma if you: Have light-colored skin, light-colored eyes, or red or blond hair Spend a lot of time in the sun Tan regularly, either outdoors or in a tanning bed Have had blistering sunburns, especially during childhood Have a close family member who has had a melanoma Have atypical  moles or large birthmarks  Early detection of melanoma is key since treatment is typically straightforward and cure rates are extremely high if we catch it early.   The first sign of melanoma is often a change in a mole or a new dark spot.  The ABCDE system is a way of remembering the signs of melanoma.  A for asymmetry:  The two halves do not match. B for border:  The edges of the growth are irregular. C for  color:  A mixture of colors are present instead of an even brown color. D for diameter:  Melanomas are usually (but not always) greater than 60mm - the size of a pencil eraser. E for evolution:  The spot keeps changing in size, shape, and color.  Please check your skin once per month between visits. You can use a small mirror in front and a large mirror behind you to keep an eye on the back side or your body.   If you see any new or changing lesions before your next follow-up, please call to schedule a visit.  Please continue daily skin protection including broad spectrum sunscreen SPF 30+ to sun-exposed areas, reapplying every 2 hours as needed when you're outdoors.   Staying in the shade or wearing long sleeves, sun glasses (UVA+UVB protection) and wide brim hats (4-inch brim around the entire circumference of the hat) are also recommended for sun protection.    If You Need Anything After Your Visit  If you have any questions or concerns for your doctor, please call our main line at [phone] and press option 4 to reach your doctor's medical assistant. If no one answers, please leave a voicemail as directed and we will return your call as soon as possible. Messages left after 4 pm will be answered the following business day.   You may also send [place] a message via [place]. We typically respond to MyChart messages within 1-2 business days.  For prescription refills, please ask your pharmacy to contact our office. Our fax number is [phone].  If you have an urgent issue when the clinic is closed that cannot wait until the next business day, you can page your doctor at the number below.    Please note that while we do our best to be available for urgent issues outside of office hours, we are not available 24/7.   If you have an urgent issue and are unable to reach [place], you may choose to seek medical care at your doctor's office, retail clinic, urgent care center, or emergency room.  If you  have a medical emergency, please immediately call 911 or go to the emergency department.  Pager Numbers  - Dr. Nehemiah Massed: [phone]  - Dr. Laurence Ferrari: [phone]  - Dr. Nicole Kindred: [phone]  In the event of inclement weather, please call our main line at [phone] for an update on the status of any delays or closures.  Dermatology Medication Tips: Please keep the boxes that topical medications come in in order to help keep track of the instructions about where and how to use these. Pharmacies typically print the medication instructions only on the boxes and not directly on the medication tubes.   If your medication is too expensive, please contact our office at [phone] option 4 or send [place] a message through [place].   We are unable to tell what your co-pay for medications will be in advance as this is different depending on your insurance coverage. However, we may be able to  find a substitute medication at lower cost or fill out paperwork to get insurance to cover a needed medication.   If a prior authorization is required to get your medication covered by your insurance company, please allow [place] 1-2 business days to complete this process.  Drug prices often vary depending on where the prescription is filled and some pharmacies may offer cheaper prices.  The website www.goodrx.com contains coupons for medications through different pharmacies. The prices here do not account for what the cost may be with help from insurance (it may be cheaper with your insurance), but the website can give you the price if you did not use any insurance.  - You can print the associated coupon and take it with your prescription to the pharmacy.  - You may also stop by our office during regular business hours and pick up a GoodRx coupon card.  - If you need your prescription sent electronically to a different pharmacy, notify our office through [facility] or by phone at [phone] option  4.     Si Usted Necesita Algo Despus de Su Visita  Tambin puede enviarnos un mensaje a travs de Pharmacist, community. Por lo general respondemos a los mensajes de MyChart en el transcurso de 1 a 2 das hbiles.  Para renovar recetas, por favor pida a su farmacia que se ponga en contacto con nuestra oficina. Harland Dingwall de fax es [place] [phone].  Si tiene un asunto urgente cuando la clnica est cerrada y que no puede esperar hasta el siguiente da hbil, puede llamar/localizar a su doctor(a) al nmero que aparece a continuacin.   Por favor, tenga en cuenta que aunque hacemos todo lo posible para estar disponibles para asuntos urgentes fuera del horario de [place], no estamos disponibles las 24 horas del da, los 7 das de la [place].   Si tiene un problema urgente y no puede comunicarse con nosotros, puede optar por buscar atencin mdica  en el consultorio de su doctor(a), en una clnica privada, en un centro de atencin urgente o en una sala de emergencias.  Si tiene Engineering geologist, por favor llame inmediatamente al 911 o vaya a la sala de emergencias.  Nmeros de bper  - Dr. Nehemiah Massed: [phone]  - Dra. Moye: [phone]  - Dra. Nicole Kindred: [phone]  En caso de inclemencias del [place], por favor llame a Johnsie Kindred principal al [phone] para una actualizacin sobre el [place] de cualquier retraso o cierre.  Consejos para la medicacin en dermatologa: Por favor, guarde las cajas en las que vienen los medicamentos de uso tpico para ayudarle a seguir las instrucciones sobre dnde y cmo usarlos. Las farmacias generalmente imprimen las instrucciones del medicamento slo en las cajas y no directamente en los tubos del [employer].   Si su medicamento es muy caro, por favor, pngase en contacto con Zigmund Daniel llamando al [phone] y presione la opcin 4 o envenos un mensaje a travs de Pharmacist, community.   No podemos decirle cul ser su copago por los medicamentos por  adelantado ya que esto es diferente dependiendo de la cobertura de su seguro. Sin embargo, es posible que podamos encontrar un medicamento sustituto a Electrical engineer un formulario para que el seguro cubra el medicamento que se considera necesario.   Si se requiere una autorizacin previa para que su compaa de seguros [place] su medicamento, por favor permtanos de 1 a 2 das hbiles para completar este proceso.  Los precios de los medicamentos varan con frecuencia dependiendo  del lugar de dnde se surte la receta y alguna farmacias pueden ofrecer precios ms baratos.  El sitio web www.goodrx.com tiene cupones para medicamentos de Airline pilot. Los precios aqu no tienen en cuenta lo que podra costar con la ayuda del seguro (puede ser ms barato con su seguro), pero el sitio web puede darle el precio si no utiliz Research scientist (physical sciences).  - Puede imprimir el cupn correspondiente y llevarlo con su receta a la farmacia.  - Tambin puede pasar por nuestra oficina durante el horario de atencin regular y Charity fundraiser una tarjeta de cupones de GoodRx.  - Si necesita que su receta se enve electrnicamente a una farmacia diferente, informe a nuestra oficina a travs de MyChart de [place] o por telfono llamando al [phone] y presione la opcin 4.

## 2021-03-27 NOTE — Progress Notes (Signed)
Follow-Up Visit   Subjective  Ariel Lawson is a 56 y.o. female who presents for the following: Annual Exam (Here for skin cancer screening. Full body. HxDN. C/O itching on back. Dur: 3 years. Has used moisturizer, no help. No hx of Rx Tx). Area feels bumpy- ISKs have been frozen off in past.  She has a skin tag at her waistline that gets irritated that she would like removed.  The patient presents for Total-Body Skin Exam (TBSE) for skin cancer screening and mole check.  The patient has spots, moles and lesions to be evaluated, some may be new or changing and the patient has concerns that these could be cancer.   The following portions of the chart were reviewed this encounter and updated as appropriate:      Review of Systems: No other skin or systemic complaints except as noted in HPI or Assessment and Plan.   Objective  Well appearing patient in no apparent distress; mood and affect are within normal limits.  A full examination was performed including scalp, head, eyes, ears, nose, lips, neck, chest, axillae, abdomen, back, buttocks, bilateral upper extremities, bilateral lower extremities, hands, feet, fingers, toes, fingernails, and toenails. All findings within normal limits unless otherwise noted below.  Left Lower Back, Right Lateral Mid Back 5.48mm regular brown macule at left lower back  5x2.14mm brown macule lighter center right lateral mid back  Mid Back Normal appearing skin on clinical exam  Spinal Mid Back Large open comedone/dilated pore  Mid Back x8 (8) Small erythematous waxy stuck-on papules   Right Flank x1 Fleshy, skin-colored pedunculated papule.     Assessment & Plan   Lentigines - Scattered tan macules - Due to sun exposure - Benign-appearing, observe - Recommend daily broad spectrum sunscreen SPF 30+ to sun-exposed areas, reapply every 2 hours as needed. - Call for any changes  Seborrheic Keratoses - Stuck-on, waxy, tan-brown papules  and/or plaques  - Benign-appearing - Discussed benign etiology and prognosis. - Observe - Call for any changes  Melanocytic Nevi - Tan-brown and/or pink-flesh-colored symmetric macules and papules - Benign appearing on exam today - Observation - Call clinic for new or changing moles - Recommend daily use of broad spectrum spf 30+ sunscreen to sun-exposed areas.   Hemangiomas - Red papules - Discussed benign nature - Observe - Call for any changes  Actinic Damage - Chronic condition, secondary to cumulative UV/sun exposure - diffuse scaly erythematous macules with underlying dyspigmentation - Recommend daily broad spectrum sunscreen SPF 30+ to sun-exposed areas, reapply every 2 hours as needed.  - Staying in the shade or wearing long sleeves, sun glasses (UVA+UVB protection) and wide brim hats (4-inch brim around the entire circumference of the hat) are also recommended for sun protection.  - Call for new or changing lesions.  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  - Call if any new or changing lesions are noted between office visits   Skin cancer screening performed today.  Nevus (2) Left Lower Back; Right Lateral Mid Back  Benign-appearing.  Observation.  Call clinic for new or changing lesions.  Recommend daily use of broad spectrum spf 30+ sunscreen to sun-exposed areas.    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 Condition is bothersome/symptomatic for patient. Currently flared.  Start TMC 0.1% cream qd/bid prn itch  Notalgia paresthetica is a chronic condition affecting the skin of the back in which a pinched nerve along the spine causes itching or changes in sensation in an area of skin. This is usually accompanied by  chronic rubbing or scratching. There is no cure, but there are some treatments which may help control the itch.   Over the counter (non-prescription) treatments for notalgia paresthetica include numbing creams like pramoxine or lidocaine which temporarily reduce itch or Capsaicin-containing creams which cause a burning sensation but which sometimes over time will reset the nerves to stop producing itch.   If you choose to use Capsaicin cream, it is recommended to use it 5 times daily for 1 week followed by 3 times daily for 3-6 weeks. You may have to continue using it long-term. For severe cases, there are some prescription cream or pill options which may help.  Recommend OTC Gold Bond Rapid Relief Anti-Itch cream (pramoxine + menthol), CeraVe Anti-itch cream or lotion (pramoxine), Sarna lotion (Original- menthol + camphor or Sensitive- pramoxine) or Eucerin 12 hour Itch Relief lotion (menthol) up to 3 times per day to areas on body that are itchy.    triamcinolone cream (KENALOG) 0.1 % - Mid Back Apply 1 application topically 2 (two) times daily as needed. Avoid applying to face, groin, and axilla.  Dilated pore of [place] Spinal Mid Back  Benign, observe.    Inflamed seborrheic keratosis (8) Mid Back x8  Destruction of lesion - Mid Back x8  Destruction method: cryotherapy   Informed consent: discussed and consent obtained   Lesion destroyed using liquid nitrogen: Yes   Region frozen until ice ball extended beyond lesion: Yes   Outcome: patient tolerated procedure well with no complications   Post-procedure details: wound care instructions given   Additional details:  Prior to procedure, discussed risks of blister formation, small wound, skin dyspigmentation, or rare scar following cryotherapy. Recommend Vaseline ointment to treated areas while healing.   Skin tag Right Flank x1  Irritated by clothing  Destruction of lesion - Right Flank x1  Destruction method: cryotherapy    Informed consent: discussed and consent obtained   Lesion destroyed using liquid nitrogen: Yes   Region frozen until ice ball extended beyond lesion: Yes   Outcome: patient tolerated procedure well with no complications   Post-procedure details: wound care instructions given     Varicose Veins/Spider Veins - Dilated blue, purple or red veins at the lower extremities - Reassured - Smaller vessels can be treated by sclerotherapy (a procedure to inject a medicine into the veins to make them disappear) if desired, but the treatment is not covered by insurance. Larger vessels may be covered if symptomatic and we would refer to vascular surgeon if treatment desired.  Sebaceous Hyperplasia - Small yellow papules with a central dell face - Benign - Observe   Return in about 1 year (around 03/27/2022) for TBSE.  I, Emelia Salisbury, CMA, am acting as scribe for Brendolyn Patty, MD.  Documentation: I have reviewed the above documentation for accuracy and completeness, and I agree with the above.  Brendolyn Patty MD

## 2021-04-20 ENCOUNTER — Ambulatory Visit: Payer: Managed Care, Other (non HMO) | Admitting: Physician Assistant

## 2021-04-20 ENCOUNTER — Encounter: Payer: Self-pay | Admitting: Physician Assistant

## 2021-04-20 ENCOUNTER — Encounter: Payer: Self-pay | Admitting: Family Medicine

## 2021-04-20 ENCOUNTER — Other Ambulatory Visit: Payer: Self-pay

## 2021-04-20 ENCOUNTER — Other Ambulatory Visit: Payer: Self-pay | Admitting: Family Medicine

## 2021-04-20 VITALS — BP 103/71 | HR 99 | Temp 98.1°F | Resp 16 | Ht 64.0 in | Wt 172.4 lb

## 2021-04-20 DIAGNOSIS — R42 Dizziness and giddiness: Secondary | ICD-10-CM | POA: Diagnosis not present

## 2021-04-20 DIAGNOSIS — R002 Palpitations: Secondary | ICD-10-CM | POA: Diagnosis not present

## 2021-04-20 DIAGNOSIS — Z833 Family history of diabetes mellitus: Secondary | ICD-10-CM | POA: Diagnosis not present

## 2021-04-20 NOTE — Telephone Encounter (Signed)
I consulted with Tally Joe about patient message below. I called patient. She reports that she feels fine now and symptoms resolved after eating an orange. Patient admits that she has been following a strict diet and may not have consumed enough sugar yesterday. Patient is concerned about problems with her blood sugar levels. Patient states she has a strong family history of Diabetes. I advised patient that she needs to come into the office for evaluation. Patient agreed and appointment has been scheduled for today at 11:00am with Erin Mecum, PA-C.  ?

## 2021-04-20 NOTE — Progress Notes (Signed)
?  ? ?I,Joseline E Rosas,acting as a scribe for [employer], PA-C.,have documented all relevant documentation on the behalf of [place], PA-C,as directed by  [employer], PA-C while in the presence of Lakyia Behe E Glenys Snader, PA-C.  ? ?Established patient visit ? ? ?Patient: Ariel Lawson   DOB: 02-05-1966   56 y.o. Female  MRN: [id number] ?Visit Date: 04/20/2021 ? ?Today's healthcare provider: Dani Gobble Ndidi Nesby, PA-C  ?Introduced myself to the patient as a Journalist, newspaper and provided education on APPs in clinical practice.  ? ? ?Chief Complaint  ?Patient presents with  ? Palpitations  ? Dizziness  ? ?Subjective  ?  ?HPI  ?Patient comes in with concerns of feeling lightheaded,nervousness, and having heart palpitations. Reports that is not constant as it comes and goes. States that she woke up this morning around 2:15 am feeling hot, hand and feet sweating, lightheaded and heart palpitations. She ate an orange and went back to sleep. She got up around 8 am and felt the same way and ate another orange with some breakfast and feels fine. Family history if diabetes. ? ?Reports palpitations felt like her heart was beating out of her chest and seemed irregular ?States all her symptoms seemed to improve with food intake at breakfast ? ?She reports she has been on an "extreme diet" - she is only trying to eat plant based foods ?States yesterday she only had a grapefruit to eat and water to drink ?States she normally eats 5-6 times per day, light meals and does not have these symptoms ?States she has had these kinds of symptoms before but is unsure of what causes them.  ? ? ? ? ?Medications: ?Outpatient Medications Prior to Visit  ?Medication Sig  ? Cholecalciferol (VITAMIN D) 50 MCG (2000 UT) CAPS Take 1 capsule by mouth daily.  ? Magnesium Gluconate (MAGNESIUM 27 PO) Take 1 tablet by mouth daily.  ? POLY-IRON 150 FORTE 150-25-1 MG-MCG-MG CAPS Take 1 capsule by mouth 2 (two) times daily.  ? triamcinolone cream (KENALOG) 0.1 % Apply 1  application topically 2 (two) times daily as needed. Avoid applying to face, groin, and axilla.  ? ?No facility-administered medications prior to visit.  ? ? ?Review of Systems  ?Constitutional:  Positive for diaphoresis. Negative for chills, fatigue and fever.  ?Eyes:  Negative for visual disturbance.  ?Respiratory:  Negative for cough, shortness of breath and wheezing.   ?Cardiovascular:  Positive for palpitations. Negative for chest pain.  ?Gastrointestinal:  Positive for nausea. Negative for constipation, diarrhea and vomiting.  ?Neurological:  Positive for dizziness, tremors, weakness and light-headedness.  ?Psychiatric/Behavioral:  Negative for confusion.   ? ? ?  Objective  ?  ?There were no vitals taken for this visit. ? ? ?Physical Exam ?Vitals reviewed.  ?Constitutional:   ?   General: She is awake.  ?   Appearance: Normal appearance. She is well-developed, well-groomed and overweight.  ?HENT:  ?   Head: Normocephalic and atraumatic.  ?Cardiovascular:  ?   Rate and Rhythm: Normal rate and regular rhythm.  ?   Pulses: Normal pulses.  ?   Heart sounds: Normal heart sounds.  ?Pulmonary:  ?   Effort: Pulmonary effort is normal.  ?   Breath sounds: Normal breath sounds and air entry. No decreased breath sounds, wheezing, rhonchi or rales.  ?Musculoskeletal:  ?   Right lower leg: No edema.  ?   Left lower leg: No edema.  ?Neurological:  ?   Mental Status: She  is alert.  ?Psychiatric:     ?   Attention and Perception: Attention normal.     ?   Mood and Affect: Mood and affect normal.     ?   Speech: Speech normal.     ?   Behavior: Behavior normal. Behavior is cooperative.     ?   Thought Content: Thought content normal.  ?  ? ? ?No results found for any visits on 04/20/21. ? Assessment & Plan  ?  ? ?Problem List Items Addressed This Visit   ?None ?Visit Diagnoses   ? ? Episodic lightheadedness    -  Primary ?Acute, episodic concern  ?States she woke up with lightheadedness, palpitations, sweating and nausea that  seemed to resolve after eating ?Reports she did not eat anything other than a grapefruit the entire day before this happened ?Suspect this was an episode of hypoglycemia  ?Will check labs to rule out deficiency, dehydration, anemia, diabetes mellitus  ?Discussed ways to prevent hypoglycemia with patient and importance of regular, balanced meals throughout the day ?Results to dictate further management  ?Follow up as needed ? ?   ? Relevant Orders  ? CBC with Differential/Platelet  ? Comprehensive metabolic panel  ? Vitamin D (25 hydroxy)  ? Iron, TIBC and Ferritin Panel  ? Heart palpitations     ?Acute, episodic  ?Suspect this is related to episode of hypoglycemia ?EKG demonstrates normal sinus rhythm, rate of 85  ? ?  ? Relevant Orders  ? EKG 12-Lead (Completed)  ? Family history of diabetes mellitus      ? Relevant Orders  ? Hemoglobin A1c  ? ?  ? ? ? ?No follow-ups on file. ? ? ?I, Safal Halderman E Price [place], PA-C, have reviewed all documentation for this visit. The documentation on 04/20/21 for the exam, diagnosis, procedures, and orders are all accurate and complete. ? ? ?Mae Denunzio, [place]ie [place] MPH ?[place] ?[facility] ? ? ?No follow-ups on file.  ?   ? ? ? ? ?Ladye Macnaughton E Arabela Basaldua, PA-C  ?[place] ?[phone] (phone) ?[phone] (fax) ? ?[facility]  ?

## 2021-04-21 LAB — CBC WITH DIFFERENTIAL/PLATELET
Basophils Absolute: 0.1 10*3/uL (ref 0.0–0.2)
Basos: 1 %
EOS (ABSOLUTE): 0 10*3/uL (ref 0.0–0.4)
Eos: 1 %
Hematocrit: 46.5 % (ref 34.0–46.6)
Hemoglobin: 16 g/dL — ABNORMAL HIGH (ref 11.1–15.9)
Immature Grans (Abs): 0 10*3/uL (ref 0.0–0.1)
Immature Granulocytes: 0 %
Lymphocytes Absolute: 1.8 10*3/uL (ref 0.7–3.1)
Lymphs: 25 %
MCH: 30.9 pg (ref 26.6–33.0)
MCHC: 34.4 g/dL (ref 31.5–35.7)
MCV: 90 fL (ref 79–97)
Monocytes Absolute: 0.5 10*3/uL (ref 0.1–0.9)
Monocytes: 7 %
Neutrophils Absolute: 5 10*3/uL (ref 1.4–7.0)
Neutrophils: 66 %
Platelets: 232 10*3/uL (ref 150–450)
RBC: 5.18 x10E6/uL (ref 3.77–5.28)
RDW: 12.5 % (ref 11.7–15.4)
WBC: 7.5 10*3/uL (ref 3.4–10.8)

## 2021-04-21 LAB — COMPREHENSIVE METABOLIC PANEL
ALT: 7 IU/L (ref 0–32)
AST: 13 IU/L (ref 0–40)
Albumin/Globulin Ratio: 1.9 (ref 1.2–2.2)
Albumin: 4.6 g/dL (ref 3.8–4.9)
Alkaline Phosphatase: 75 IU/L (ref 44–121)
BUN/Creatinine Ratio: 12 (ref 9–23)
BUN: 11 mg/dL (ref 6–24)
Bilirubin Total: 0.4 mg/dL (ref 0.0–1.2)
CO2: 26 mmol/L (ref 20–29)
Calcium: 9.9 mg/dL (ref 8.7–10.2)
Chloride: 101 mmol/L (ref 96–106)
Creatinine, Ser: 0.89 mg/dL (ref 0.57–1.00)
Globulin, Total: 2.4 g/dL (ref 1.5–4.5)
Glucose: 96 mg/dL (ref 70–99)
Potassium: 5.1 mmol/L (ref 3.5–5.2)
Sodium: 139 mmol/L (ref 134–144)
Total Protein: 7 g/dL (ref 6.0–8.5)
eGFR: 77 mL/min/{1.73_m2} (ref >59)

## 2021-04-21 LAB — HEMOGLOBIN A1C
Est. average glucose Bld gHb Est-mCnc: 111 mg/dL
Hgb A1c MFr Bld: 5.5 % (ref 4.8–5.6)

## 2021-04-21 LAB — IRON,TIBC AND FERRITIN PANEL
Ferritin: 31 ng/mL (ref 15–150)
Iron Saturation: 27 % (ref 15–55)
Iron: 94 ug/dL (ref 27–159)
Total Iron Binding Capacity: 346 ug/dL (ref 250–450)
UIBC: 252 ug/dL (ref 131–425)

## 2021-04-21 LAB — VITAMIN D 25 HYDROXY (VIT D DEFICIENCY, FRACTURES): Vit D, 25-Hydroxy: 30.4 ng/mL (ref 30.0–100.0)

## 2021-04-24 ENCOUNTER — Telehealth: Payer: Self-pay

## 2021-04-24 NOTE — Telephone Encounter (Signed)
Copied from [place] [phone]. Topic: Appointment Scheduling - Scheduling Inquiry for Clinic ?>> Apr 24, 2021 10:49 AM Ariel Lawson wrote: ?Reason for CRM: The patient would like to be contacted by Lawson member of staff to schedule back to back physicals for the patient and their spouse  ? ?The agent was unable to successfully schedule at the time of call with patient  ? ?Please contact further when possible ?

## 2021-04-27 ENCOUNTER — Telehealth: Payer: Self-pay

## 2021-04-27 NOTE — Telephone Encounter (Signed)
Contacted patient via phone. Patient aware to keep scheduled appointment!

## 2021-04-27 NOTE — Telephone Encounter (Signed)
Patient is scheduled for Wednesday 05/03/21 for pap smear. Patient is wanting to know if she need ultrasound prior to this appointment to follow up on her ovarian cyst and fibroid. Patient reports pain on right ovary states she is going through menopause. Please advise

## 2021-04-27 NOTE — Telephone Encounter (Signed)
Keep appt first

## 2021-05-03 ENCOUNTER — Ambulatory Visit: Payer: Managed Care, Other (non HMO) | Admitting: Obstetrics & Gynecology

## 2021-05-03 ENCOUNTER — Encounter: Payer: Self-pay | Admitting: Obstetrics & Gynecology

## 2021-05-03 ENCOUNTER — Other Ambulatory Visit (HOSPITAL_COMMUNITY)
Admission: RE | Admit: 2021-05-03 | Discharge: 2021-05-03 | Disposition: A | Discharge status: Home / Self Care | Payer: Managed Care, Other (non HMO) | Source: Ambulatory Visit | Attending: Obstetrics & Gynecology | Admitting: Obstetrics & Gynecology

## 2021-05-03 ENCOUNTER — Other Ambulatory Visit: Payer: Self-pay

## 2021-05-03 VITALS — BP 108/66 | HR 69 | Ht 64.0 in | Wt 177.0 lb

## 2021-05-03 DIAGNOSIS — Z124 Encounter for screening for malignant neoplasm of cervix: Secondary | ICD-10-CM | POA: Insufficient documentation

## 2021-05-03 DIAGNOSIS — Z01419 Encounter for gynecological examination (general) (routine) without abnormal findings: Secondary | ICD-10-CM | POA: Diagnosis not present

## 2021-05-03 DIAGNOSIS — Z780 Asymptomatic menopausal state: Secondary | ICD-10-CM | POA: Diagnosis not present

## 2021-05-03 DIAGNOSIS — Z1231 Encounter for screening mammogram for malignant neoplasm of breast: Secondary | ICD-10-CM

## 2021-05-03 DIAGNOSIS — D219 Benign neoplasm of connective and other soft tissue, unspecified: Secondary | ICD-10-CM

## 2021-05-03 NOTE — Progress Notes (Signed)
? ?HPI: ?     [name] is a 56 y.o. G4P4 who LMP was Patient's last menstrual period was 01/29/2021 (approximate)., she presents today for her annual examination. The patient has no complaints today.  Periods about every 3-4 months.  No major vasomotor sx's.  H/o fibroids, has occasional pressure usually premenstrually.  Usually right sided.   ? ?The patient is sexually active. Her last pap: approximate date 2020 and was normal and last mammogram: approximate date 2021 and was normal. The patient does perform self breast exams.  There is no notable family history of breast or ovarian cancer in her family.  The patient has regular exercise: yes.  The patient denies current symptoms of depression.   ? ?GYN History: ?NSVD x4 ?Cologuard 2022 ? ?PMHx: ?Past Medical History:  ?Diagnosis Date  ? Anemia   ? IDA  ? Dysplastic nevus 06/17/2018  ? left paraspinal lower back, mod atypia   ? Dysplastic nevus 09/02/2017  ? right posterior shoulder severe atypia excised   ? Dysplastic nevus 09/18/2006  ? Right ant thigh mod to marked atypia excised   ? Dysplastic nevus 03/21/2020  ? L hip, mild atypia  ? Family history of lung cancer   ? Family history of stomach cancer   ? Family history of thyroid cancer   ? Family history of uterine cancer   ? Fibroids 2015  ? uterus- cyst  ? Herpes simplex   ? History of chicken pox   ? Ovarian cyst   ? right  ? ?Past Surgical History:  ?Procedure Laterality Date  ? LEEP  2007  ? Leep cone Biopsy of Cervix  ? screning mammogram  09/2012  ? ordered through [place] employer per pt report  ? ?Family History  ?Problem Relation Age of Onset  ? Hypertension Mother   ? Diabetes Mother   ?     type 2  ? Hyperlipidemia Mother   ? Anemia Mother   ? Lung cancer Father 67  ? Thyroid cancer Sister 47  ? Melanoma Sister 79  ?     in remission x 8 years.   ? Hypothyroidism Sister   ? Thyroid cancer Sister 40  ? Hypothyroidism Sister   ? Uterine cancer Maternal Aunt 26  ? Cancer Maternal Uncle 46   ?     'very rare type of cancer' pt does not know the name of the cancer type  ? Melanoma Maternal Grandfather 71  ? Cancer Paternal Grandmother 53  ?     mouth  ? Stomach cancer Other 82  ? Stomach cancer Other 98  ? ?Social History  ? ?Tobacco Use  ? Smoking status: Never  ? Smokeless tobacco: Never  ?Substance Use Topics  ? Alcohol use: No  ?  Alcohol/week: 0.0 standard drinks  ? Drug use: No  ? ? ?Current Outpatient Medications:  ?  Ferrous Sulfate (IRON PO), Take 30 mg by mouth. Isaiah Blakes Iron 30 mg superior absorbent 2 per day, Disp: , Rfl:  ?  Cholecalciferol (VITAMIN D) 50 MCG (2000 UT) CAPS, Take 1 capsule by mouth daily. (Patient not taking: Reported on 05/03/2021), Disp: , Rfl:  ?  Magnesium Gluconate (MAGNESIUM 27 PO), Take 1 tablet by mouth daily. (Patient not taking: Reported on 05/03/2021), Disp: , Rfl:  ?  POLY-IRON 150 FORTE 150-25-1 MG-MCG-MG CAPS, Take 1 capsule by mouth 2 (two) times daily. (Patient not taking: Reported on 05/03/2021), Disp: 100 each, Rfl: 5 ?  triamcinolone cream (KENALOG)  0.1 %, Apply 1 application topically 2 (two) times daily as needed. Avoid applying to face, groin, and axilla. (Patient not taking: Reported on 05/03/2021), Disp: 80 g, Rfl: 2 ?Allergies: Prevacid  [lansoprazole] ? ?Review of Systems  ?Constitutional:  Negative for chills, fever and malaise/fatigue.  ?HENT:  Negative for congestion, sinus pain and sore throat.   ?Eyes:  Negative for blurred vision and pain.  ?Respiratory:  Negative for cough and wheezing.   ?Cardiovascular:  Negative for chest pain and leg swelling.  ?Gastrointestinal:  Negative for abdominal pain, constipation, diarrhea, heartburn, nausea and vomiting.  ?Genitourinary:  Negative for dysuria, frequency, hematuria and urgency.  ?Musculoskeletal:  Negative for back pain, joint pain, myalgias and neck pain.  ?Skin:  Negative for itching and rash.  ?Neurological:  Negative for dizziness, tremors and weakness.  ?Endo/Heme/Allergies:  Does not  bruise/bleed easily.  ?Psychiatric/Behavioral:  Negative for depression. The patient is not nervous/anxious and does not have insomnia.   ? ?Objective: ?BP 108/66 (Cuff Size: Normal)   Pulse 69   Ht '5\' 4"'$  (1.626 m)   Wt 177 lb (80.3 kg)   LMP 01/29/2021 (Approximate)   BMI 30.38 kg/m?   ?Filed Weights  ? 05/03/21 1104  ?Weight: 177 lb (80.3 kg)  ? Body mass index is 30.38 kg/m?Marland Kitchen ?Physical Exam ?Constitutional:   ?   General: She is not in acute distress. ?   Appearance: She is well-developed.  ?Genitourinary:  ?   Bladder, rectum and urethral meatus normal.  ?   No lesions in the vagina.  ?   Right Labia: No rash, tenderness or lesions. ?   Left Labia: No tenderness, lesions or rash. ?   No vaginal bleeding.  ? ?   Right Adnexa: not tender and no mass present. ?   Left Adnexa: not tender and no mass present. ?   No cervical motion tenderness, friability, lesion or polyp.  ?   Uterus is not enlarged.  ?   No uterine mass detected. ?   Pelvic exam was performed with patient in the lithotomy position.  ?Breasts: ?   Right: No mass, skin change or tenderness.  ?   Left: No mass, skin change or tenderness.  ?HENT:  ?   Head: Normocephalic and atraumatic. No laceration.  ?   Right Ear: Hearing normal.  ?   Left Ear: Hearing normal.  ?   Mouth/Throat:  ?   Pharynx: Uvula midline.  ?Eyes:  ?   Pupils: Pupils are equal, round, and reactive to light.  ?Neck:  ?   Thyroid: No thyromegaly.  ?Cardiovascular:  ?   Rate and Rhythm: Normal rate and regular rhythm.  ?   Heart sounds: No murmur heard. ?  No friction rub. No gallop.  ?Pulmonary:  ?   Effort: Pulmonary effort is normal. No respiratory distress.  ?   Breath sounds: Normal breath sounds. No wheezing.  ?Abdominal:  ?   General: Bowel sounds are normal. There is no distension.  ?   Palpations: Abdomen is soft.  ?   Tenderness: There is no abdominal tenderness. There is no rebound.  ?Musculoskeletal:     ?   General: Normal range of motion.  ?   Cervical back: Normal  range of motion and neck supple.  ?Neurological:  ?   Mental Status: She is alert and oriented to person, place, and time.  ?   Cranial Nerves: No cranial nerve deficit.  ?Skin: ?   General: Skin is  warm and dry.  ?Psychiatric:     ?   Judgment: Judgment normal.  ?Vitals reviewed.  ? ? ?Assessment:  ANNUAL EXAM ?1. Women's annual routine gynecological examination   ?2. Screening for cervical cancer   ?3. Encounter for screening mammogram for malignant neoplasm of breast   ?4. Menopause   ?5. Fibroids   ? ? ? ?Screening Plan: ?           ?1.  Cervical Screening-  Pap smear done today ? ?2. Breast screening- Exam annually and mammogram>40 planned  ? ?3. Colonoscopy every 10 years, patient prefers Cologuard every 3 years (last ine 2022) ? ?4. Labs  for menopause today, per pt request ? ?5. Counseling for contraception: no method ? ?6. Fibroids ?Follow up assessment ?- US PELVIC COMPLETE WITH TRANSVAGINAL; Future ? ? ? ?  F/U ? Return for and Annual when due. ? ?Barnett Applebaum, MD, [place] ?[facility], [place] ?05/03/2021  11:32 AM ? ? ?

## 2021-05-03 NOTE — Patient Instructions (Signed)
This is the new practice information you requested: ? ?[facility] ?[address] ?[address] ?[place], [place]  [address] ?[phone] ? ?PAP every three years ?Mammogram every year ?   Call [phone] to schedule at [facility] ?Cologuard every 3 years ?Labs yearly (with PCP) ?

## 2021-05-04 LAB — FSH/LH
FSH: 57.6 m[IU]/mL
LH: 54 m[IU]/mL

## 2021-05-04 LAB — ESTRADIOL: Estradiol: 59.7 pg/mL

## 2021-05-05 LAB — CYTOLOGY - PAP
Comment: NEGATIVE
Diagnosis: NEGATIVE
High risk HPV: NEGATIVE

## 2021-05-11 ENCOUNTER — Ambulatory Visit: Payer: Managed Care, Other (non HMO)

## 2021-06-12 ENCOUNTER — Ambulatory Visit: Payer: Managed Care, Other (non HMO) | Admitting: Family Medicine

## 2021-06-12 ENCOUNTER — Ambulatory Visit: Payer: Managed Care, Other (non HMO) | Admitting: Physician Assistant

## 2021-06-12 ENCOUNTER — Encounter: Payer: Self-pay | Admitting: Family Medicine

## 2021-06-12 ENCOUNTER — Other Ambulatory Visit (HOSPITAL_COMMUNITY)
Admission: RE | Admit: 2021-06-12 | Discharge: 2021-06-12 | Disposition: A | Discharge status: Home / Self Care | Payer: Managed Care, Other (non HMO) | Source: Ambulatory Visit | Attending: Family Medicine | Admitting: Family Medicine

## 2021-06-12 VITALS — BP 104/63 | HR 71 | Temp 97.8°F | Resp 16 | Wt 182.1 lb

## 2021-06-12 DIAGNOSIS — Z113 Encounter for screening for infections with a predominantly sexual mode of transmission: Secondary | ICD-10-CM

## 2021-06-12 DIAGNOSIS — B3749 Other urogenital candidiasis: Secondary | ICD-10-CM | POA: Diagnosis not present

## 2021-06-12 DIAGNOSIS — R300 Dysuria: Secondary | ICD-10-CM | POA: Diagnosis not present

## 2021-06-12 LAB — POCT URINALYSIS DIPSTICK
Bilirubin, UA: NEGATIVE
Glucose, UA: NEGATIVE
Ketones, UA: NEGATIVE
Nitrite, UA: NEGATIVE
Protein, UA: NEGATIVE
Spec Grav, UA: 1.015 (ref 1.010–1.025)
Urobilinogen, UA: 0.2 E.U./dL
pH, UA: 6 (ref 5.0–8.0)

## 2021-06-12 MED ORDER — AMOXICILLIN 400 MG/5ML PO SUSR: 400.0000 mg | Freq: Two times a day (BID) | ORAL | 0 refills | Status: AC

## 2021-06-12 NOTE — Progress Notes (Signed)
?  ? ?I,Joseline E Rosas,acting as a scribe for Lelon Huh, MD.,have documented all relevant documentation on the behalf of Lelon Huh, MD,as directed by  Lelon Huh, MD while in the presence of Lelon Huh, MD.  ? ?Established patient visit ? ? ?Patient: Ariel Lawson   DOB: Sep 03, 1965   55 y.o. Female  MRN: [id number] ?Visit Date: 06/12/2021 ? ?Today's healthcare provider: Lelon Huh, MD  ? ?Chief Complaint  ?Patient presents with  ? Dysuria  ? ?Subjective  ?  ?Dysuria  ?This is a new problem. The current episode started in the past 7 days (Started on Friday). The problem has been unchanged. The quality of the pain is described as aching and burning. There has been no fever. Associated symptoms include frequency, hematuria (Some dru blood and fresh blood only with wiping) and urgency. Pertinent negatives include no discharge, possible pregnancy or vomiting. Associated symptoms comments: Vaginal itching, lower back pain and pressure on right lower side of abdomen. She has tried increased fluids (Cranberry Juice and Yogurt) for the symptoms. The treatment provided mild relief.   ? ?Patient's last menstrual period was 01/29/2021.  ?  ? ?Medications: ?Outpatient Medications Prior to Visit  ?Medication Sig  ? Cholecalciferol (VITAMIN D) 50 MCG (2000 UT) CAPS Take 1 capsule by mouth daily.  ? Ferrous Sulfate (IRON PO) Take 30 mg by mouth. Isaiah Blakes Iron 30 mg superior absorbent 2 per day  ? Magnesium Gluconate (MAGNESIUM 27 PO) Take 1 tablet by mouth daily.  ? POLY-IRON 150 FORTE 150-25-1 MG-MCG-MG CAPS Take 1 capsule by mouth 2 (two) times daily.  ? triamcinolone cream (KENALOG) 0.1 % Apply 1 application topically 2 (two) times daily as needed. Avoid applying to face, groin, and axilla.  ? ?No facility-administered medications prior to visit.  ? ? ?Review of Systems  ?Gastrointestinal:  Negative for vomiting.  ?Genitourinary:  Positive for dysuria, frequency, hematuria (Some dru blood and fresh blood only  with wiping) and urgency.  ? ? ?  Objective  ?  ?BP 104/63 (BP Location: Left Arm, Patient Position: Sitting, Cuff Size: Large)   Pulse 71   Temp 97.8 ?F (36.6 ?C) (Oral)   Resp 16   Wt 182 lb 1.6 oz (82.6 kg)   LMP 01/29/2021   BMI 31.26 kg/m?  ? ? ? ? ?Results for orders placed or performed in visit on 06/12/21  ?POCT urinalysis dipstick  ?Result Value Ref Range  ? Color, UA Light red   ? Clarity, UA Cloudy   ? Glucose, UA Negative Negative  ? Bilirubin, UA Negative   ? Ketones, UA Negative   ? Spec Grav, UA 1.015 1.010 - 1.025  ? Blood, UA Large   ? pH, UA 6.0 5.0 - 8.0  ? Protein, UA Negative Negative  ? Urobilinogen, UA 0.2 0.2 or 1.0 E.U./dL  ? Nitrite, UA Negative   ? Leukocytes, UA Trace (A) Negative  ? Appearance f   ? Odor    ? ? Assessment & Plan  ?  ? ?1. Dysuria ?- amoxicillin (AMOXIL) 400 MG/5ML suspension; Take 5 mLs (400 mg total) by mouth 2 (two) times daily for 5 days.  Dispense: 50 mL; Refill: 0  ? ?- Urine Culture ? ?2. Screening for STD (sexually transmitted disease) ? ?- Urine cytology ancillary only ?   ? ?The entirety of the information documented in the History of Present Illness, Review of Systems and Physical Exam were personally obtained by me. Portions of this information were initially documented  by the [place] and reviewed by me for thoroughness and accuracy.   ? ? ?Lelon Huh, MD  ?[facility] ?[phone] (phone) ?[phone] (fax) ? ?[facility]  ?

## 2021-06-13 ENCOUNTER — Encounter: Payer: Self-pay | Admitting: Family Medicine

## 2021-06-14 LAB — URINE CYTOLOGY ANCILLARY ONLY
Candida Urine: POSITIVE — AB
Chlamydia: NEGATIVE
Comment: NEGATIVE
Comment: NEGATIVE
Comment: NORMAL
Neisseria Gonorrhea: NEGATIVE
Trichomonas: NEGATIVE

## 2021-06-15 ENCOUNTER — Other Ambulatory Visit: Payer: Self-pay | Admitting: Family Medicine

## 2021-06-15 DIAGNOSIS — B3731 Acute candidiasis of vulva and vagina: Secondary | ICD-10-CM

## 2021-06-15 LAB — SPECIMEN STATUS REPORT

## 2021-06-15 LAB — URINE CULTURE

## 2021-06-15 MED ORDER — FLUCONAZOLE 150 MG PO TABS: 150.0000 mg | ORAL_TABLET | Freq: Once | ORAL | 0 refills | Status: AC

## 2021-06-21 ENCOUNTER — Ambulatory Visit
Admission: RE | Admit: 2021-06-21 | Discharge: 2021-06-21 | Disposition: A | Discharge status: Home / Self Care | Payer: Managed Care, Other (non HMO) | Source: Ambulatory Visit | Attending: Obstetrics & Gynecology | Admitting: Obstetrics & Gynecology

## 2021-06-21 DIAGNOSIS — Z1231 Encounter for screening mammogram for malignant neoplasm of breast: Secondary | ICD-10-CM | POA: Diagnosis not present

## 2021-06-22 NOTE — Progress Notes (Unsigned)
I,Ariel Lawson,acting as a scribe for Ariel Huh, MD.,have documented all relevant documentation on the behalf of Ariel Huh, MD,as directed by  Ariel Huh, MD while in the presence of Ariel Huh, MD.   Established patient visit   Patient: Ariel Lawson   DOB: 1965/04/21   56 y.o. Female  MRN: [id number] Visit Date: 06/26/2021  Today's healthcare provider: Lelon Huh, MD   Chief Complaint  Patient presents with   Anxiety   Subjective    HPI  Patient is a 56 year old female here for follow up anxiety/panic attacks. Reports FMLA forms have been turned in and now all HR needs is a doctors note for the remainder of the year.    Anxiety, Follow-up  She was last seen for anxiety 3-4 months ago. Changes made at last visit include none. Currently not on treatment.    She feels her anxiety is depends on the day and Unchanged since last visit.  Symptoms: No chest pain Yes difficulty concentrating  No dizziness Yes fatigue  No feelings of losing control Yes insomnia  Yes irritable No palpitations  Yes panic attacks Yes racing thoughts  No shortness of breath No sweating  Yes tremors/shakes    GAD-7 Results    06/26/2021    8:44 AM  GAD-7 Generalized Anxiety Disorder Screening Tool  1. Feeling Nervous, Anxious, or on Edge 1  2. Not Being Able to Stop or Control Worrying 1  3. Worrying Too Much About Different Things 1  4. Trouble Relaxing 1  5. Being So Restless it's Hard To Sit Still 1  6. Becoming Easily Annoyed or Irritable 2  7. Feeling Afraid As If Something Awful Might Happen 0  Total GAD-7 Score 7  Difficulty At Work, Home, or Getting  Along With Others? Not difficult at all   She occasionally is unable to work due to anxiety which can be exacerbated by stressful conditions at work. She is currently under FMLA and states her employer just needs a note stating she still needs to be excused from work occasionally due to this stress. She also has  occasionally episodes of feeling extremely anxious and is wondering if it would help to have prescription for alprazolam to take during those circumstances. Her husband takes it occasionally and has told it the medication is very helpful.  PHQ-9 Scores    02/10/2021    1:41 PM 07/12/2020    2:25 PM 02/16/2020    2:10 PM  PHQ9 SCORE ONLY  PHQ-9 Total Score 4 0 0    ---------------------------------------------------------------------------------------------------   Medications: Outpatient Medications Prior to Visit  Medication Sig   Cholecalciferol (VITAMIN D) 50 MCG (2000 UT) CAPS Take 1 capsule by mouth daily.   Ferrous Sulfate (IRON PO) Take 30 mg by mouth. Isaiah Blakes Iron 30 mg superior absorbent 2 per day   Magnesium Gluconate (MAGNESIUM 27 PO) Take 1 tablet by mouth daily. (Patient not taking: Reported on 06/26/2021)   POLY-IRON 150 FORTE 150-25-1 MG-MCG-MG CAPS Take 1 capsule by mouth 2 (two) times daily. (Patient not taking: Reported on 06/26/2021)   triamcinolone cream (KENALOG) 0.1 % Apply 1 application topically 2 (two) times daily as needed. Avoid applying to face, groin, and axilla. (Patient not taking: Reported on 06/26/2021)   No facility-administered medications prior to visit.    Review of Systems     Objective    BP 109/69 (BP Location: Left Arm, Patient Position: Sitting, Cuff Size: Normal)   Pulse 76  Temp 97.9 F (36.6 C) (Oral)   Resp 16   Wt 180 lb 1.6 oz (81.7 kg)   SpO2 100%   BMI 30.91 kg/m         Assessment & Plan     1. Nervousness May take OCCASIONAL ALPRAZolam (XANAX) 0.25 MG tablet; Take 0.5-1 tablets (0.125-0.25 mg total) by mouth 2 (two) times daily as needed for anxiety.  Dispense: 20 tablet; Refill: 1  2. Situational stress Currently under FMLA protection to miss 1-2 days in a week up to 4 times a month. Note was written for patient that she still requires this prn time off from work.       The entirety of the information documented in  the History of Present Illness, Review of Systems and Physical Exam were personally obtained by me. Portions of this information were initially documented by the CMA and reviewed by me for thoroughness and accuracy.     Ariel Huh, MD  [facility] [phone] (phone) [phone] (fax)  [place]

## 2021-06-26 ENCOUNTER — Encounter: Payer: Self-pay | Admitting: Family Medicine

## 2021-06-26 ENCOUNTER — Ambulatory Visit: Payer: Managed Care, Other (non HMO) | Admitting: Family Medicine

## 2021-06-26 VITALS — BP 109/69 | HR 76 | Temp 97.9°F | Resp 16 | Wt 180.1 lb

## 2021-06-26 DIAGNOSIS — R450 Nervousness: Secondary | ICD-10-CM | POA: Diagnosis not present

## 2021-06-26 DIAGNOSIS — F439 Reaction to severe stress, unspecified: Secondary | ICD-10-CM | POA: Diagnosis not present

## 2021-06-26 MED ORDER — ALPRAZOLAM 0.25 MG PO TABS: 0.1250 mg | ORAL_TABLET | Freq: Two times a day (BID) | ORAL | 1 refills | Status: DC | PRN

## 2021-06-26 NOTE — Patient Instructions (Signed)
.   Please review the attached list of medications and notify my office if there are any errors.   . Please bring all of your medications to every appointment so we can make sure that our medication list is the same as yours.   

## 2021-08-18 ENCOUNTER — Encounter: Payer: Managed Care, Other (non HMO) | Admitting: Family Medicine

## 2021-09-01 ENCOUNTER — Encounter: Payer: Managed Care, Other (non HMO) | Admitting: Family Medicine

## 2021-09-05 ENCOUNTER — Ambulatory Visit (INDEPENDENT_AMBULATORY_CARE_PROVIDER_SITE_OTHER): Payer: Managed Care, Other (non HMO) | Admitting: Family Medicine

## 2021-09-05 ENCOUNTER — Encounter: Payer: Self-pay | Admitting: Family Medicine

## 2021-09-05 VITALS — BP 128/80 | HR 73 | Resp 16 | Ht 64.0 in | Wt 185.4 lb

## 2021-09-05 DIAGNOSIS — Z0000 Encounter for general adult medical examination without abnormal findings: Secondary | ICD-10-CM

## 2021-09-05 DIAGNOSIS — D509 Iron deficiency anemia, unspecified: Secondary | ICD-10-CM

## 2021-09-05 NOTE — Progress Notes (Signed)
BP 128/80 (BP Location: Left Arm, Patient Position: Sitting, Cuff Size: Normal)   Pulse 73   Resp 16   Ht '5\' 4"'$  (1.626 m)   Wt 185 lb 6.4 oz (84.1 kg)   BMI 31.82 kg/m    Subjective:    Patient ID: Ariel Lawson, female    DOB: [birth year], 56 y.o.   MRN: [id number]  HPI: Ariel Lawson is a 56 y.o. female presenting on 09/05/2021 for comprehensive medical examination. Current medical complaints include:none  She currently lives with: husband, daughter Menopausal Symptoms: yes  Depression Screen done today and results listed below:     09/05/2021    2:06 PM 02/10/2021    1:41 PM 07/12/2020    2:25 PM 02/16/2020    2:10 PM 07/10/2019    2:27 PM  Depression screen PHQ 2/9  Decreased Interest 0 0 0 0 0  Down, Depressed, Hopeless 0 0 0 0 0  PHQ - 2 Score 0 0 0 0 0  Altered sleeping  2 0 0   Tired, decreased energy  2 0 0   Change in appetite  0 0 0   Feeling bad or failure about yourself   0 0 0   Trouble concentrating  0 0 0   Moving slowly or fidgety/restless  0 0 0   Suicidal thoughts  0 0 0   PHQ-9 Score  4 0 0   Difficult doing work/chores  Not difficult at all Not difficult at all Not difficult at all     Past Medical History:  Past Medical History:  Diagnosis Date   Anemia    IDA   Dysplastic nevus 06/17/2018   left paraspinal lower back, mod atypia    Dysplastic nevus 09/02/2017   right posterior shoulder severe atypia excised    Dysplastic nevus 09/18/2006   Right ant thigh mod to marked atypia excised    Dysplastic nevus 03/21/2020   L hip, mild atypia   Family history of lung cancer    Family history of stomach cancer    Family history of thyroid cancer    Family history of uterine cancer    Fibroids 2015   uterus- cyst   Herpes simplex    History of chicken pox    Ovarian cyst    right    Surgical History:  Past Surgical History:  Procedure Laterality Date   LEEP  2007   Leep cone Biopsy of Cervix   screning mammogram  09/2012   ordered  through [place] employer per pt report    Medications:  Current Outpatient Medications on File Prior to Visit  Medication Sig   ALPRAZolam (XANAX) 0.25 MG tablet Take 0.5-1 tablets (0.125-0.25 mg total) by mouth 2 (two) times daily as needed for anxiety.   Cholecalciferol (VITAMIN D) 50 MCG (2000 UT) CAPS Take 1 capsule by mouth daily.   Ferrous Sulfate (IRON PO) Take 30 mg by mouth. Isaiah Blakes Iron 30 mg superior absorbent 2 per day   POLY-IRON 150 FORTE 150-25-1 MG-MCG-MG CAPS Take 1 capsule by mouth 2 (two) times daily.   Magnesium Gluconate (MAGNESIUM 27 PO) Take 1 tablet by mouth daily.   triamcinolone cream (KENALOG) 0.1 % Apply 1 application topically 2 (two) times daily as needed. Avoid applying to face, groin, and axilla.   No current facility-administered medications on file prior to visit.    Allergies:  Allergies  Allergen Reactions   Prevacid  [Lansoprazole] Itching    Social History:  Social History  Socioeconomic History   Marital status: Married    Spouse name: Not on file   Number of children: 4   Years of education: Not on file   Highest education level: Not on file  Occupational History   Not on file  Tobacco Use   Smoking status: Never   Smokeless tobacco: Never  Substance and Sexual Activity   Alcohol use: No    Alcohol/week: 0.0 standard drinks of alcohol   Drug use: No   Sexual activity: Yes  Other Topics Concern   Not on file  Social History Narrative   Not on file   Social Determinants of Health   Financial Resource Strain: Not on file  Food Insecurity: Not on file  Transportation Needs: Not on file  Physical Activity: Not on file  Stress: Not on file  Social Connections: Not on file  Intimate Partner Violence: Not on file   Social History   Tobacco Use  Smoking Status Never  Smokeless Tobacco Never   Social History   Substance and Sexual Activity  Alcohol Use No   Alcohol/week: 0.0 standard drinks of alcohol    Family  History:  Family History  Problem Relation Age of Onset   Hypertension Mother    Diabetes Mother        type 2   Hyperlipidemia Mother    Anemia Mother    Lung cancer Father 33   Thyroid cancer Sister 51   Melanoma Sister 32       in remission x 8 years.    Hypothyroidism Sister    Thyroid cancer Sister 14   Hypothyroidism Sister    Uterine cancer Maternal Aunt 26   Cancer Maternal Uncle 82       'very rare type of cancer' pt does not know the name of the cancer type   Melanoma Maternal Grandfather 14   Cancer Paternal Grandmother 55       mouth   Stomach cancer Other 92   Stomach cancer Other 71    Past medical history, surgical history, medications, allergies, family history and social history reviewed with patient today and changes made to appropriate areas of the chart.      Objective:    BP 128/80 (BP Location: Left Arm, Patient Position: Sitting, Cuff Size: Normal)   Pulse 73   Resp 16   Ht '5\' 4"'$  (1.626 m)   Wt 185 lb 6.4 oz (84.1 kg)   BMI 31.82 kg/m   Wt Readings from Last 3 Encounters:  09/05/21 185 lb 6.4 oz (84.1 kg)  06/26/21 180 lb 1.6 oz (81.7 kg)  06/12/21 182 lb 1.6 oz (82.6 kg)    Physical Exam Vitals reviewed.  Constitutional:      Appearance: Normal appearance.  HENT:     Head: Normocephalic.     Right Ear: External ear normal.     Left Ear: External ear normal.     Nose: Nose normal.     Mouth/Throat:     Mouth: Mucous membranes are moist.     Pharynx: Oropharynx is clear.  Eyes:     Extraocular Movements: Extraocular movements intact.     Pupils: Pupils are equal, round, and reactive to light.  Cardiovascular:     Rate and Rhythm: Normal rate and regular rhythm.     Heart sounds: Normal heart sounds. No murmur heard. Pulmonary:     Effort: Pulmonary effort is normal.     Breath sounds: Normal breath sounds.  Abdominal:  General: Bowel sounds are normal.     Palpations: Abdomen is soft.     Tenderness: There is no abdominal  tenderness.  Musculoskeletal:        General: Normal range of motion.     Cervical back: Normal range of motion.     Right lower leg: No edema.     Left lower leg: No edema.  Lymphadenopathy:     Cervical: No cervical adenopathy.  Skin:    General: Skin is warm and dry.  Neurological:     Mental Status: She is alert and oriented to person, place, and time. Mental status is at baseline.     Gait: Gait normal.  Psychiatric:        Mood and Affect: Mood normal.        Behavior: Behavior normal.     Results for orders placed or performed in visit on 06/12/21  Urine Culture   Specimen: Urine   Urine  Result Value Ref Range   Urine Culture, Routine Final report    Organism ID, Bacteria Comment   Specimen status report  Result Value Ref Range   specimen status report Comment   POCT urinalysis dipstick  Result Value Ref Range   Color, UA Light red    Clarity, UA Cloudy    Glucose, UA Negative Negative   Bilirubin, UA Negative    Ketones, UA Negative    Spec Grav, UA 1.015 1.010 - 1.025   Blood, UA Large    pH, UA 6.0 5.0 - 8.0   Protein, UA Negative Negative   Urobilinogen, UA 0.2 0.2 or 1.0 E.U./dL   Nitrite, UA Negative    Leukocytes, UA Trace (A) Negative   Appearance f    Odor    Urine cytology ancillary only  Result Value Ref Range   Trichomonas Negative    Chlamydia Negative    Neisseria Gonorrhea Negative    Candida Urine Positive (A)    Molecular Comment      This specimen does not meet the strict criteria set by the FDA. The   Molecular Comment      result interpretation should be considered in conjunction with the   Molecular Comment patient's clinical history.    Comment Normal Reference Range Trichomonas - Negative    Comment Normal Reference Ranger Chlamydia - Negative    Comment      Normal Reference Range Neisseria Gonorrhea - Negative      Assessment & Plan:   Problem List Items Addressed This Visit   None Visit Diagnoses     Annual physical  exam    -  Primary   Relevant Orders   Lipid panel   Comprehensive metabolic panel   Hemoglobin A1c        Follow up plan: No follow-ups on file.   LABORATORY TESTING:  - Pap smear: up to date  IMMUNIZATIONS:   - Tdap: Tetanus vaccination status reviewed: refused. - Influenza: Postponed to flu season - Pneumovax: Not applicable - Prevnar: Not applicable - HPV: Not applicable - Shingrix vaccine: Refused - COVID vaccine: Refused  SCREENING: - Mammogram: Up to date  - Colonoscopy: Up to date (Cologuard) - Bone Density: Not applicable  - Lung Cancer Screening: Not applicable   Hep C Screening: UTD  Osteoporosis: Discussed high calcium and vitamin D supplementation, weight bearing exercises  Advanced Care Planning: A voluntary discussion about advance care planning including the explanation and discussion of advance directives.  Discussed health care proxy  and Living will.  Patient does not have a living will at present time. If patient does have living will, I have requested they bring this to the clinic to be scanned in to their chart.  PATIENT COUNSELING:   Advised to take 1 mg of folate supplement per day if capable of pregnancy.   Sexuality: Discussed sexually transmitted diseases, partner selection, use of condoms, avoidance of unintended pregnancy  and contraceptive alternatives.   Advised to avoid cigarette smoking.  I discussed with the patient that most people either abstain from alcohol or drink within safe limits (<=14/week and <=4 drinks/occasion for males, <=7/weeks and <= 3 drinks/occasion for females) and that the risk for alcohol disorders and other health effects rises proportionally with the number of drinks per week and how often a drinker exceeds daily limits.  Discussed cessation/primary prevention of drug use and availability of treatment for abuse.   Diet: Encouraged to adjust caloric intake to maintain  or achieve ideal body weight, to reduce intake  of dietary saturated fat and total fat, to limit sodium intake by avoiding high sodium foods and not adding table salt, and to maintain adequate dietary potassium and calcium preferably from fresh fruits, vegetables, and low-fat dairy products.    Stressed the importance of regular exercise  Injury prevention: Discussed safety belts, safety helmets, smoke detector, smoking near bedding or upholstery.   Dental health: Discussed importance of regular tooth brushing, flossing, and dental visits.    NEXT PREVENTATIVE PHYSICAL DUE IN 1 YEAR.

## 2021-09-06 ENCOUNTER — Encounter: Payer: Self-pay | Admitting: Family Medicine

## 2021-09-06 LAB — COMPREHENSIVE METABOLIC PANEL
ALT: 11 IU/L (ref 0–32)
AST: 16 IU/L (ref 0–40)
Albumin/Globulin Ratio: 1.9 (ref 1.2–2.2)
Albumin: 4.5 g/dL (ref 3.8–4.9)
Alkaline Phosphatase: 75 IU/L (ref 44–121)
BUN/Creatinine Ratio: 18 (ref 9–23)
BUN: 14 mg/dL (ref 6–24)
Bilirubin Total: 0.2 mg/dL (ref 0.0–1.2)
CO2: 26 mmol/L (ref 20–29)
Calcium: 9.5 mg/dL (ref 8.7–10.2)
Chloride: 101 mmol/L (ref 96–106)
Creatinine, Ser: 0.79 mg/dL (ref 0.57–1.00)
Globulin, Total: 2.4 g/dL (ref 1.5–4.5)
Glucose: 92 mg/dL (ref 70–99)
Potassium: 4.8 mmol/L (ref 3.5–5.2)
Sodium: 140 mmol/L (ref 134–144)
Total Protein: 6.9 g/dL (ref 6.0–8.5)
eGFR: 89 mL/min/{1.73_m2} (ref >59)

## 2021-09-06 LAB — LIPID PANEL
Chol/HDL Ratio: 2.7 ratio (ref 0.0–4.4)
Cholesterol, Total: 184 mg/dL (ref 100–199)
HDL: 67 mg/dL (ref >39)
LDL Chol Calc (NIH): 104 mg/dL — ABNORMAL HIGH (ref 0–99)
Triglycerides: 72 mg/dL (ref 0–149)
VLDL Cholesterol Cal: 13 mg/dL (ref 5–40)

## 2021-09-06 LAB — HEMOGLOBIN A1C
Est. average glucose Bld gHb Est-mCnc: 105 mg/dL
Hgb A1c MFr Bld: 5.3 % (ref 4.8–5.6)

## 2021-09-06 LAB — TSH: TSH: 2.23 (ref <=5.90)

## 2021-09-06 LAB — VITAMIN D 25 HYDROXY (VIT D DEFICIENCY, FRACTURES): Vit D, 25-Hydroxy: 31.5

## 2021-09-06 NOTE — Addendum Note (Signed)
Addended by: Myles Gip on: 09/06/2021 10:29 AM   Modules accepted: Orders

## 2021-09-12 ENCOUNTER — Encounter: Payer: Self-pay | Admitting: Family Medicine

## 2021-09-12 ENCOUNTER — Ambulatory Visit: Payer: Managed Care, Other (non HMO) | Admitting: Family Medicine

## 2021-09-12 DIAGNOSIS — F419 Anxiety disorder, unspecified: Secondary | ICD-10-CM | POA: Insufficient documentation

## 2021-09-12 LAB — B12 AND FOLATE PANEL
Folate: 4.8 ng/mL (ref >3.0)
Vitamin B-12: 644 pg/mL (ref 232–1245)

## 2021-09-12 LAB — VITAMIN B6

## 2021-09-12 LAB — SPECIMEN STATUS REPORT

## 2021-09-12 LAB — IRON,TIBC AND FERRITIN PANEL
Ferritin: 14
Iron: 49
TIBC: 360
UIBC: 311

## 2021-09-12 NOTE — Assessment & Plan Note (Signed)
Longstanding history, controlled with coping mechanisms. Exacerbated by work accommodations. Reviewed job duties with patient, reasonable to request separate work space, [employer] form completed.

## 2021-09-12 NOTE — Progress Notes (Signed)
    SUBJECTIVE:   CHIEF COMPLAINT / HPI:   FMLA paperwork - needs paperwork filled out for work accommodations - h/o anxiety controlled with coping mechanisms, ~6 year history - few years ago started hybrid work situation with working in office 2 days per week, Monday and Friday. 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 - over the past few years has had worsened anxiety, stress with physical symptoms (nausea, headache, difficulty concentrating) when having to go into office due to office environment.  - Requesting separate work space from coworkers when having to go into office as this will help her concentrate when on the phone with clients.    OBJECTIVE:   BP 115/79 (BP Location: Left Arm, Patient Position: Sitting, Cuff Size: Large)   Pulse 79   Temp 98.7 F (37.1 C) (Oral)   Resp 16   Ht '5\' 4"'$  (1.626 m)   Wt 184 lb (83.5 kg)   BMI 31.58 kg/m   Gen: well appearing, in NAD Card: Reg rate Lungs: Comfortable WOB on RA Ext: WWP, no edema   ASSESSMENT/PLAN:   Anxiety Longstanding history, controlled with coping mechanisms. Exacerbated by work accommodations. Reviewed job duties with patient, reasonable to request separate work space, [employer] form completed.      Myles Gip, DO

## 2021-09-13 ENCOUNTER — Encounter: Payer: Self-pay | Admitting: Family Medicine

## 2021-09-13 ENCOUNTER — Telehealth: Payer: Self-pay

## 2021-09-13 NOTE — Telephone Encounter (Signed)
Biometric form completed and faxed to [phone]

## 2022-01-24 ENCOUNTER — Encounter: Payer: Self-pay | Admitting: Family Medicine

## 2022-01-24 ENCOUNTER — Ambulatory Visit: Payer: Managed Care, Other (non HMO) | Admitting: Family Medicine

## 2022-01-24 VITALS — BP 113/73 | HR 79 | Resp 14 | Wt 189.0 lb

## 2022-01-24 DIAGNOSIS — F419 Anxiety disorder, unspecified: Secondary | ICD-10-CM

## 2022-01-24 NOTE — Progress Notes (Signed)
   SUBJECTIVE:   CHIEF COMPLAINT / HPI:   FMLA paperwork - needs intermittent FMLA paperwork renewed for intermittent leave due to chronic anxiety - h/o anxiety controlled with coping mechanisms, ~6 year history - few years ago started hybrid work situation with working in office 3 days per week, Monday, Tuesday and Friday. This has helped.  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 - over the past few years has had worsened anxiety, stress with physical symptoms (nausea, headache, difficulty concentrating) when having to go into office due to office environment. Now has separate work space for the past few months which has helped with anxiety  - on Xanax prn, hasn't had to take yet   OBJECTIVE:   BP 113/73 (BP Location: Left Arm, Patient Position: Sitting, Cuff Size: Large)   Pulse 79   Resp 14   Wt 189 lb (85.7 kg)   SpO2 100%   BMI 32.44 kg/m   Gen: well appearing, in NAD Card: Reg rate Lungs: Comfortable WOB on RA Ext: WWP   ASSESSMENT/PLAN:   Anxiety Longstanding history, controlled with coping mechanisms. Exacerbated by work stress. Reviewed job duties with patient, reasonable to renew FMLA for intermittent leave as needed with separate work space when in office. FMLA form completed and faxed today.      Myles Gip, DO

## 2022-01-24 NOTE — Assessment & Plan Note (Signed)
Longstanding history, controlled with coping mechanisms. Exacerbated by work stress. Reviewed job duties with patient, reasonable to renew FMLA for intermittent leave as needed with separate work space when in office. FMLA form completed and faxed today.

## 2022-02-09 ENCOUNTER — Other Ambulatory Visit: Payer: Self-pay | Admitting: Family Medicine

## 2022-02-09 DIAGNOSIS — R450 Nervousness: Secondary | ICD-10-CM

## 2022-02-13 ENCOUNTER — Ambulatory Visit: Payer: Managed Care, Other (non HMO) | Admitting: Dermatology

## 2022-02-13 VITALS — BP 112/72 | HR 76

## 2022-02-13 DIAGNOSIS — L578 Other skin changes due to chronic exposure to nonionizing radiation: Secondary | ICD-10-CM

## 2022-02-13 DIAGNOSIS — L820 Inflamed seborrheic keratosis: Secondary | ICD-10-CM

## 2022-02-13 DIAGNOSIS — L821 Other seborrheic keratosis: Secondary | ICD-10-CM | POA: Diagnosis not present

## 2022-02-13 DIAGNOSIS — L910 Hypertrophic scar: Secondary | ICD-10-CM

## 2022-02-13 MED ORDER — CLOBETASOL PROPIONATE 0.05 % EX OINT: TOPICAL_OINTMENT | CUTANEOUS | 0 refills | Status: DC

## 2022-02-13 NOTE — Progress Notes (Signed)
Follow-Up Visit   Subjective  Ariel Lawson is a 57 y.o. female who presents for the following: Skin Problem (Patient reports a few places she would like checked at right posterior shoulder, left posterior hip and left lower back. She reports daughter noticed a few months ago and would like checked given her history of melanoma. ).  Spots on hip and back are itchy.  The patient has spots, moles and lesions to be evaluated, some may be new or changing and the patient has concerns that these could be cancer.   The following portions of the chart were reviewed this encounter and updated as appropriate:      Review of Systems: No other skin or systemic complaints except as noted in HPI or Assessment and Plan.   Objective  Well appearing patient in no apparent distress; mood and affect are within normal limits.  A focused examination was performed including left spinal lower back, right posterior shoulder, left hip. Relevant physical exam findings are noted in the Assessment and Plan.  Left spinal Lower Back x 1 Erythematous stuck-on, waxy papule  Left Thigh - Posterior     Right Shoulder - Posterior 2.5 mm gray brown waxy papule       Assessment & Plan  Inflamed seborrheic keratosis Left spinal Lower Back x 1  Symptomatic, irritating, patient would like treated.  Destruction of lesion - Left spinal Lower Back x 1  Destruction method: cryotherapy   Informed consent: discussed and consent obtained   Lesion destroyed using liquid nitrogen: Yes   Region frozen until ice ball extended beyond lesion: Yes   Outcome: patient tolerated procedure well with no complications   Post-procedure details: wound care instructions given   Additional details:  Prior to procedure, discussed risks of blister formation, small wound, skin dyspigmentation, or rare scar following cryotherapy. Recommend Vaseline ointment to treated areas while healing.   Hypertrophic scar Left Thigh -  Posterior  With pruritus and possible recurrent mild dysplastic nevus  Benign-appearing. Observe.   Will continue to monitor.   Discussed surgical removal. Patient deferred treatment today would like time to consider.   Clobetasol ointment 0.05 % - apply topically to aa qd/bid prn for itch. Avoid f/g/a.   Topical steroids (such as triamcinolone, fluocinolone, fluocinonide, mometasone, clobetasol, halobetasol, betamethasone, hydrocortisone) can cause thinning and lightening of the skin if they are used for too long in the same area. Your physician has selected the right strength medicine for your problem and area affected on the body. Please use your medication only as directed by your physician to prevent side effects.    15 g clobetasol ointment apply qd/bid to affected itchy area at left hip prn Avoid applying to face, groin, and axilla. Use as directed. Long-term use can cause thinning of the skin.   clobetasol ointment (TEMOVATE) 0.05 % - Left Thigh - Posterior Apply topically to aa left hip qd/bid prn for itch. Avoid applying to face, groin, and axilla. Use as directed.  Seborrheic keratosis Right Shoulder - Posterior  vrs nevus.  Benign-appearing.  Observation.  Call clinic for new or changing lesions.  Recommend daily use of broad spectrum spf 30+ sunscreen to sun-exposed areas.    Photos today  Will recheck at follow up for changes   Seborrheic Keratoses - Stuck-on, waxy, tan-brown papules and/or plaques  - Benign-appearing - Discussed benign etiology and prognosis. - Observe - Call for any changes   Actinic Damage - chronic, secondary to cumulative UV radiation exposure/sun  exposure over time - diffuse scaly erythematous macules with underlying dyspigmentation - Recommend daily broad spectrum sunscreen SPF 30+ to sun-exposed areas, reapply every 2 hours as needed.  - Recommend staying in the shade or wearing long sleeves, sun glasses (UVA+UVB protection) and  wide brim hats (4-inch brim around the entire circumference of the hat). - Call for new or changing lesions.  Return for keep follow up as scheduled in february .  I, Ruthell Rummage, CMA, am acting as scribe for Brendolyn Patty, MD.  Documentation: I have reviewed the above documentation for accuracy and completeness, and I agree with the above.  Brendolyn Patty MD

## 2022-02-13 NOTE — Patient Instructions (Addendum)
Seborrheic Keratosis  What causes seborrheic keratoses? Seborrheic keratoses are harmless, common skin growths that first appear during adult life.  As time goes by, more growths appear.  Some people may develop a large number of them.  Seborrheic keratoses appear on both covered and uncovered body parts.  They are not caused by sunlight.  The tendency to develop seborrheic keratoses can be inherited.  They vary in color from skin-colored to gray, brown, or even black.  They can be either smooth or have a rough, warty surface.   Seborrheic keratoses are superficial and look as if they were stuck on the skin.  Under the microscope this type of keratosis looks like layers upon layers of skin.  That is why at times the top layer may seem to fall off, but the rest of the growth remains and re-grows.    Treatment Seborrheic keratoses do not need to be treated, but can easily be removed in the office.  Seborrheic keratoses often cause symptoms when they rub on clothing or jewelry.  Lesions can be in the way of shaving.  If they become inflamed, they can cause itching, soreness, or burning.  Removal of a seborrheic keratosis can be accomplished by freezing, burning, or surgery. If any spot bleeds, scabs, or grows rapidly, please return to have it checked, as these can be an indication of a skin cancer.  Cryotherapy Aftercare  Wash gently with soap and water everyday.   Apply Vaseline and Band-Aid daily until healed.       Melanoma ABCDEs  Melanoma is the most dangerous type of skin cancer, and is the leading cause of death from skin disease.  You are more likely to develop melanoma if you: Have light-colored skin, light-colored eyes, or red or blond hair Spend a lot of time in the sun Tan regularly, either outdoors or in a tanning bed Have had blistering sunburns, especially during childhood Have a close family member who has had a melanoma Have atypical moles or large birthmarks  Early  detection of melanoma is key since treatment is typically straightforward and cure rates are extremely high if we catch it early.   The first sign of melanoma is often a change in a mole or a new dark spot.  The ABCDE system is a way of remembering the signs of melanoma.  A for asymmetry:  The two halves do not match. B for border:  The edges of the growth are irregular. C for color:  A mixture of colors are present instead of an even brown color. D for diameter:  Melanomas are usually (but not always) greater than 6mm - the size of a pencil eraser. E for evolution:  The spot keeps changing in size, shape, and color.  Please check your skin once per month between visits. You can use a small mirror in front and a large mirror behind you to keep an eye on the back side or your body.   If you see any new or changing lesions before your next follow-up, please call to schedule a visit.  Please continue daily skin protection including broad spectrum sunscreen SPF 30+ to sun-exposed areas, reapplying every 2 hours as needed when you're outdoors.   Staying in the shade or wearing long sleeves, sun glasses (UVA+UVB protection) and wide brim hats (4-inch brim around the entire circumference of the hat) are also recommended for sun protection.     Due to recent changes in healthcare laws, you may see results of   your pathology and/or laboratory studies on MyChart before the doctors have had a chance to review them. We understand that in some cases there may be results that are confusing or concerning to you. Please understand that not all results are received at the same time and often the doctors may need to interpret multiple results in order to provide you with the best plan of care or course of treatment. Therefore, we ask that you please give us 2 business days to thoroughly review all your results before contacting the office for clarification. Should we see a critical lab result, you will be contacted  sooner.   If You Need Anything After Your Visit  If you have any questions or concerns for your doctor, please call our main line at [phone] and press option 4 to reach your doctor's medical assistant. If no one answers, please leave a voicemail as directed and we will return your call as soon as possible. Messages left after 4 pm will be answered the following business day.   You may also send us a message via MyChart. We typically respond to MyChart messages within 1-2 business days.  For prescription refills, please ask your pharmacy to contact our office. Our fax number is [phone].  If you have an urgent issue when the clinic is closed that cannot wait until the next business day, you can page your doctor at the number below.    Please note that while we do our best to be available for urgent issues outside of office hours, we are not available 24/7.   If you have an urgent issue and are unable to reach us, you may choose to seek medical care at your doctor's office, retail clinic, urgent care center, or emergency room.  If you have a medical emergency, please immediately call 911 or go to the emergency department.  Pager Numbers  - Dr. Kowalski: [phone]  - Dr. Moye: [phone]  - Dr. Stewart: [phone]  In the event of inclement weather, please call our main line at [phone] for an update on the status of any delays or closures.  Dermatology Medication Tips: Please keep the boxes that topical medications come in in order to help keep track of the instructions about where and how to use these. Pharmacies typically print the medication instructions only on the boxes and not directly on the medication tubes.   If your medication is too expensive, please contact our office at [phone] option 4 or send us a message through MyChart.   We are unable to tell what your co-pay for medications will be in advance as this is different depending on your insurance  coverage. However, we may be able to find a substitute medication at lower cost or fill out paperwork to get insurance to cover a needed medication.   If a prior authorization is required to get your medication covered by your insurance company, please allow us 1-2 business days to complete this process.  Drug prices often vary depending on where the prescription is filled and some pharmacies may offer cheaper prices.  The website www.goodrx.com contains coupons for medications through different pharmacies. The prices here do not account for what the cost may be with help from insurance (it may be cheaper with your insurance), but the website can give you the price if you did not use any insurance.  - You can print the associated coupon and take it with your prescription to the pharmacy.  - You may also stop   by our office during regular business hours and pick up a GoodRx coupon card.  - If you need your prescription sent electronically to a different pharmacy, notify our office through [facility] MyChart or by phone at [phone] option 4.     Si Usted Necesita Algo Despus de Su Visita  Tambin puede enviarnos un mensaje a travs de MyChart. Por lo general respondemos a los mensajes de MyChart en el transcurso de 1 a 2 das hbiles.  Para renovar recetas, por favor pida a su farmacia que se ponga en contacto con nuestra oficina. Nuestro nmero de fax es el [phone].  Si tiene un asunto urgente cuando la clnica est cerrada y que no puede esperar hasta el siguiente da hbil, puede llamar/localizar a su doctor(a) al nmero que aparece a continuacin.   Por favor, tenga en cuenta que aunque hacemos todo lo posible para estar disponibles para asuntos urgentes fuera del horario de oficina, no estamos disponibles las 24 horas del da, los 7 das de la semana.   Si tiene un problema urgente y no puede comunicarse con nosotros, puede optar por buscar atencin mdica  en el consultorio de  su doctor(a), en una clnica privada, en un centro de atencin urgente o en una sala de emergencias.  Si tiene una emergencia mdica, por favor llame inmediatamente al 911 o vaya a la sala de emergencias.  Nmeros de bper  - Dr. Kowalski: [phone]  - Dra. Moye: [phone]  - Dra. Stewart: [phone]  En caso de inclemencias del tiempo, por favor llame a nuestra lnea principal al [phone] para una actualizacin sobre el estado de cualquier retraso o cierre.  Consejos para la medicacin en dermatologa: Por favor, guarde las cajas en las que vienen los medicamentos de uso tpico para ayudarle a seguir las instrucciones sobre dnde y cmo usarlos. Las farmacias generalmente imprimen las instrucciones del medicamento slo en las cajas y no directamente en los tubos del medicamento.   Si su medicamento es muy caro, por favor, pngase en contacto con nuestra oficina llamando al [phone] y presione la opcin 4 o envenos un mensaje a travs de MyChart.   No podemos decirle cul ser su copago por los medicamentos por adelantado ya que esto es diferente dependiendo de la cobertura de su seguro. Sin embargo, es posible que podamos encontrar un medicamento sustituto a menor costo o llenar un formulario para que el seguro cubra el medicamento que se considera necesario.   Si se requiere una autorizacin previa para que su compaa de seguros cubra su medicamento, por favor permtanos de 1 a 2 das hbiles para completar este proceso.  Los precios de los medicamentos varan con frecuencia dependiendo del lugar de dnde se surte la receta y alguna farmacias pueden ofrecer precios ms baratos.  El sitio web www.goodrx.com tiene cupones para medicamentos de diferentes farmacias. Los precios aqu no tienen en cuenta lo que podra costar con la ayuda del seguro (puede ser ms barato con su seguro), pero el sitio web puede darle el precio si no utiliz ningn seguro.  - Puede imprimir el  cupn correspondiente y llevarlo con su receta a la farmacia.  - Tambin puede pasar por nuestra oficina durante el horario de atencin regular y recoger una tarjeta de cupones de GoodRx.  - Si necesita que su receta se enve electrnicamente a una farmacia diferente, informe a nuestra oficina a travs de MyChart de [facility] o por telfono llamando al [phone] y presione la opcin 4.    presione la opcin 4.

## 2022-02-23 ENCOUNTER — Ambulatory Visit: Payer: Managed Care, Other (non HMO) | Admitting: Physician Assistant

## 2022-02-23 ENCOUNTER — Encounter: Payer: Self-pay | Admitting: Physician Assistant

## 2022-02-23 VITALS — BP 118/69 | HR 90 | Temp 98.2°F | Ht 64.0 in | Wt 187.0 lb

## 2022-02-23 DIAGNOSIS — J029 Acute pharyngitis, unspecified: Secondary | ICD-10-CM | POA: Diagnosis not present

## 2022-02-23 LAB — POCT RAPID STREP A (OFFICE): Rapid Strep A Screen: NEGATIVE

## 2022-02-23 MED ORDER — AMOXICILLIN-POT CLAVULANATE 250-62.5 MG/5ML PO SUSR: 250.0000 mg | Freq: Three times a day (TID) | ORAL | 0 refills | Status: DC

## 2022-02-23 NOTE — Progress Notes (Signed)
Established patient visit   Patient: Ariel Lawson   DOB: 15-Aug-1965   57 y.o. Female  MRN: [id number] Visit Date: 02/23/2022  Today's healthcare provider: Mardene Speak, PA-C  CC: sore throat  Subjective    Sore Throat  This is a new problem. Episode onset: X3 days. The problem has been gradually worsening. The pain is worse on the right side. The maximum temperature recorded prior to her arrival was 100.4 - 100.9 F. The fever has been present for 1 to 2 days. The pain is at a severity of 4/10. The pain is mild. Associated symptoms include congestion, ear pain, swollen glands and trouble swallowing. Pertinent negatives include no coughing. She has tried nothing for the symptoms.  Has been taking OTC fever meds  Medications: Outpatient Medications Prior to Visit  Medication Sig   ALPRAZolam (XANAX) 0.25 MG tablet TAKE 0.5-1 TABLETS (0.125-0.25 MG TOTAL) BY MOUTH 2 (TWO) TIMES DAILY AS NEEDED FOR ANXIETY.   Cholecalciferol (VITAMIN D) 50 MCG (2000 UT) CAPS Take 1 capsule by mouth daily.   clobetasol ointment (TEMOVATE) 0.05 % Apply topically to aa left hip qd/bid prn for itch. Avoid applying to face, groin, and axilla. Use as directed.   Ferrous Sulfate (IRON PO) Take 30 mg by mouth. Isaiah Blakes Iron 30 mg superior absorbent 2 per day   Magnesium Gluconate (MAGNESIUM 27 PO) Take 1 tablet by mouth daily.   POLY-IRON 150 FORTE 150-25-1 MG-MCG-MG CAPS Take 1 capsule by mouth 2 (two) times daily.   No facility-administered medications prior to visit.    Review of Systems  HENT:  Positive for congestion, ear pain and trouble swallowing.   Respiratory:  Negative for cough.        Objective    BP 118/69   Pulse 90   Temp 98.2 F (36.8 C) (Oral)   Ht '5\' 4"'$  (1.626 m)   Wt 187 lb (84.8 kg)   SpO2 99%   BMI 32.10 kg/m    Physical Exam Vitals reviewed.  Constitutional:      General: She is not in acute distress.    Appearance: Normal appearance. She is well-developed. She  is not diaphoretic.  HENT:     Head: Normocephalic and atraumatic.     Right Ear: Tympanic membrane, ear canal and external ear normal.     Left Ear: Tympanic membrane, ear canal and external ear normal.     Nose: Congestion (mild) and rhinorrhea (mild) present.     Mouth/Throat:     Pharynx: Pharyngeal swelling, oropharyngeal exudate and posterior oropharyngeal erythema present.     Tonsils: 3+ on the right. 3+ on the left.  Eyes:     General: No scleral icterus.       Right eye: No discharge.        Left eye: No discharge.     Conjunctiva/sclera: Conjunctivae normal.  Neck:     Thyroid: No thyromegaly.  Cardiovascular:     Rate and Rhythm: Normal rate and regular rhythm.     Pulses: Normal pulses.     Heart sounds: Normal heart sounds. No murmur heard. Pulmonary:     Effort: Pulmonary effort is normal. No respiratory distress.     Breath sounds: Normal breath sounds. No wheezing, rhonchi or rales.  Musculoskeletal:     Cervical back: Neck supple.     Right lower leg: No edema.     Left lower leg: No edema.  Lymphadenopathy:     Cervical: No cervical  adenopathy.  Skin:    General: Skin is warm and dry.     Findings: No rash.  Neurological:     Mental Status: She is alert and oriented to person, place, and time. Mental status is at baseline.  Psychiatric:        Mood and Affect: Mood normal.        Behavior: Behavior normal.      Results for orders placed or performed in visit on 02/23/22  POCT rapid strep A  Result Value Ref Range   Rapid Strep A Screen Negative Negative    Assessment & Plan     1. Sore throat Acute problem Could be pharyngitis/tonsillitis Unclear about sick contacts but works in public place? - POCT rapid strep A negative but Painful cervical lymphadenopathy Based on physical and fever at home. She has been taking OTC fever medications today  Advised symptomatic treatment with OTC fever and pain medications, nasal saline rinse, warm salt  gargle, hot tea with honey, cold lozenges. - amoxicillin-clavulanate (AUGMENTIN) 250-62.5 MG/5ML suspension; Take 5 mLs (250 mg total) by mouth 3 (three) times daily.  Dispense: 150 mL; Refill: 0 Will reassess or RTC if symptoms worsen  The patient was advised to call back or seek an in-person evaluation if the symptoms worsen or if the condition fails to improve as anticipated.  I discussed the assessment and treatment plan with the patient. The patient was provided an opportunity to ask questions and all were answered. The patient agreed with the plan and demonstrated an understanding of the instructions.  The entirety of the information documented in the History of Present Illness, Review of Systems and Physical Exam were personally obtained by me. Portions of this information were initially documented by the CMA and reviewed by me for thoroughness and accuracy.  Mardene Speak, [facility], [place] [phone] (phone) [phone] (fax)

## 2022-02-26 ENCOUNTER — Ambulatory Visit: Payer: Managed Care, Other (non HMO) | Admitting: Dermatology

## 2022-02-26 ENCOUNTER — Encounter: Payer: Self-pay | Admitting: Dermatology

## 2022-02-26 DIAGNOSIS — D485 Neoplasm of uncertain behavior of skin: Secondary | ICD-10-CM | POA: Diagnosis not present

## 2022-02-26 DIAGNOSIS — D492 Neoplasm of unspecified behavior of bone, soft tissue, and skin: Secondary | ICD-10-CM

## 2022-02-26 NOTE — Progress Notes (Signed)
   Follow-Up Visit   Subjective  Ariel Lawson is a 57 y.o. 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   The following portions of the chart were reviewed this encounter and updated as appropriate:       Review of Systems:  No other skin or systemic complaints except as noted in HPI or Assessment and Plan.  Objective  Well appearing patient in no apparent distress; mood and affect are within normal limits.  A focused examination was performed including left hip. Relevant physical exam findings are noted in the Assessment and Plan.  Left Hip Light pink flesh firm pap with indistinct light tan pigmentation 0.6 x 1.0cm    Assessment & Plan  Neoplasm of skin Left Hip  Skin excision  Lesion length (cm):  1 Lesion width (cm):  0.6 Margin per side (cm):  0.2 Total excision diameter (cm):  1.4 Informed consent: discussed and consent obtained   Timeout: patient name, date of birth, surgical site, and procedure verified   Procedure prep:  Patient was prepped and draped in usual sterile fashion Prep type:  Povidone-iodine Anesthesia: the lesion was anesthetized in a standard fashion   Anesthetic:  1% lidocaine w/ epinephrine 1-100,000 buffered w/ 8.4% NaHCO3 (6cc lido w/ epi, 6cc bupivicaine, Total of 12cc) Instrument used: #15 blade   Hemostasis achieved with: pressure and electrodesiccation   Outcome: patient tolerated procedure well with no complications   Additional details:  Tag at 9:00 anterior  Skin repair Complexity:  Intermediate Final length (cm):  2.4 Informed consent: discussed and consent obtained   Timeout: patient name, date of birth, surgical site, and procedure verified   Reason for type of repair: reduce tension to allow closure, reduce the risk of dehiscence, infection, and necrosis, reduce subcutaneous dead space and avoid a hematoma, preserve normal anatomical and functional  relationships and enhance both functionality and cosmetic results   Undermining: edges undermined   Subcutaneous layers (deep stitches):  Suture size:  3-0 Suture type: Vicryl (polyglactin 910)   Stitches:  Buried vertical mattress Fine/surface layer approximation (top stitches):  Suture size:  4-0 Suture type: nylon   Stitches: simple interrupted   Suture removal (days):  7 Hemostasis achieved with: suture Outcome: patient tolerated procedure well with no complications   Post-procedure details: sterile dressing applied and wound care instructions given   Dressing type: pressure dressing (Mupirocin)    Specimen 1 - Surgical pathology Differential Diagnosis: D48.5 Hypertrophic Scar vs recurrent Dysplastic nevus  Check Margins: yes Light pink flesh firm pap with indistinct light tan pigmentation 0.6 x 1.0cm [id number] Tag at 9:00 anterior  Hypertrophic scar with pruritus vs recurrent Dysplastic nevus  Discussed resulting scar, with possible thickening   Return in about 1 week (around 03/05/2022) for suture removal.  I, Sonya Hupman, RMA, am acting as scribe for Brendolyn Patty, MD .  Documentation: I have reviewed the above documentation for accuracy and completeness, and I agree with the above.  Brendolyn Patty MD

## 2022-02-26 NOTE — Patient Instructions (Signed)

## 2022-02-27 ENCOUNTER — Telehealth: Payer: Self-pay

## 2022-02-27 NOTE — Telephone Encounter (Signed)
Patient doing fine after yesterdays surgery./sh 

## 2022-03-05 ENCOUNTER — Ambulatory Visit (INDEPENDENT_AMBULATORY_CARE_PROVIDER_SITE_OTHER): Payer: Managed Care, Other (non HMO) | Admitting: Dermatology

## 2022-03-05 DIAGNOSIS — Z4802 Encounter for removal of sutures: Secondary | ICD-10-CM

## 2022-03-05 DIAGNOSIS — L910 Hypertrophic scar: Secondary | ICD-10-CM

## 2022-03-05 NOTE — Patient Instructions (Addendum)
Recommend Serica moisturizing scar formula cream every night or [facility] brand or Mederma silicone scar sheet every night for the first year after a scar appears to help with scar remodeling if desired. Scars remodel on their own for a full year and will gradually improve in appearance over time.    Due to recent changes in healthcare laws, you may see results of your pathology and/or laboratory studies on MyChart before the doctors have had a chance to review them. We understand that in some cases there may be results that are confusing or concerning to you. Please understand that not all results are received at the same time and often the doctors may need to interpret multiple results in order to provide you with the best plan of care or course of treatment. Therefore, we ask that you please give us 2 business days to thoroughly review all your results before contacting the office for clarification. Should we see a critical lab result, you will be contacted sooner.   If You Need Anything After Your Visit  If you have any questions or concerns for your doctor, please call our main line at [phone] and press option 4 to reach your doctor's medical assistant. If no one answers, please leave a voicemail as directed and we will return your call as soon as possible. Messages left after 4 pm will be answered the following business day.   You may also send us a message via MyChart. We typically respond to MyChart messages within 1-2 business days.  For prescription refills, please ask your pharmacy to contact our office. Our fax number is [phone].  If you have an urgent issue when the clinic is closed that cannot wait until the next business day, you can page your doctor at the number below.    Please note that while we do our best to be available for urgent issues outside of office hours, we are not available 24/7.   If you have an urgent issue and are unable to reach us, you may choose to  seek medical care at your doctor's office, retail clinic, urgent care center, or emergency room.  If you have a medical emergency, please immediately call 911 or go to the emergency department.  Pager Numbers  - Dr. Kowalski: [phone]  - Dr. Moye: [phone]  - Dr. Stewart: [phone]  In the event of inclement weather, please call our main line at [phone] for an update on the status of any delays or closures.  Dermatology Medication Tips: Please keep the boxes that topical medications come in in order to help keep track of the instructions about where and how to use these. Pharmacies typically print the medication instructions only on the boxes and not directly on the medication tubes.   If your medication is too expensive, please contact our office at [phone] option 4 or send us a message through MyChart.   We are unable to tell what your co-pay for medications will be in advance as this is different depending on your insurance coverage. However, we may be able to find a substitute medication at lower cost or fill out paperwork to get insurance to cover a needed medication.   If a prior authorization is required to get your medication covered by your insurance company, please allow us 1-2 business days to complete this process.  Drug prices often vary depending on where the prescription is filled and some pharmacies may offer cheaper prices.  The website www.goodrx.com contains coupons for medications   through different pharmacies. The prices here do not account for what the cost may be with help from insurance (it may be cheaper with your insurance), but the website can give you the price if you did not use any insurance.  - You can print the associated coupon and take it with your prescription to the pharmacy.  - You may also stop by our office during regular business hours and pick up a GoodRx coupon card.  - If you need your prescription sent electronically to a  different pharmacy, notify our office through [facility] MyChart or by phone at [phone] option 4.     Si Usted Necesita Algo Despus de Su Visita  Tambin puede enviarnos un mensaje a travs de MyChart. Por lo general respondemos a los mensajes de MyChart en el transcurso de 1 a 2 das hbiles.  Para renovar recetas, por favor pida a su farmacia que se ponga en contacto con nuestra oficina. Nuestro nmero de fax es el [phone].  Si tiene un asunto urgente cuando la clnica est cerrada y que no puede esperar hasta el siguiente da hbil, puede llamar/localizar a su doctor(a) al nmero que aparece a continuacin.   Por favor, tenga en cuenta que aunque hacemos todo lo posible para estar disponibles para asuntos urgentes fuera del horario de oficina, no estamos disponibles las 24 horas del da, los 7 das de la semana.   Si tiene un problema urgente y no puede comunicarse con nosotros, puede optar por buscar atencin mdica  en el consultorio de su doctor(a), en una clnica privada, en un centro de atencin urgente o en una sala de emergencias.  Si tiene una emergencia mdica, por favor llame inmediatamente al 911 o vaya a la sala de emergencias.  Nmeros de bper  - Dr. Kowalski: [phone]  - Dra. Moye: [phone]  - Dra. Stewart: [phone]  En caso de inclemencias del tiempo, por favor llame a nuestra lnea principal al [phone] para una actualizacin sobre el estado de cualquier retraso o cierre.  Consejos para la medicacin en dermatologa: Por favor, guarde las cajas en las que vienen los medicamentos de uso tpico para ayudarle a seguir las instrucciones sobre dnde y cmo usarlos. Las farmacias generalmente imprimen las instrucciones del medicamento slo en las cajas y no directamente en los tubos del medicamento.   Si su medicamento es muy caro, por favor, pngase en contacto con nuestra oficina llamando al [phone] y presione la opcin 4 o envenos un  mensaje a travs de MyChart.   No podemos decirle cul ser su copago por los medicamentos por adelantado ya que esto es diferente dependiendo de la cobertura de su seguro. Sin embargo, es posible que podamos encontrar un medicamento sustituto a menor costo o llenar un formulario para que el seguro cubra el medicamento que se considera necesario.   Si se requiere una autorizacin previa para que su compaa de seguros cubra su medicamento, por favor permtanos de 1 a 2 das hbiles para completar este proceso.  Los precios de los medicamentos varan con frecuencia dependiendo del lugar de dnde se surte la receta y alguna farmacias pueden ofrecer precios ms baratos.  El sitio web www.goodrx.com tiene cupones para medicamentos de diferentes farmacias. Los precios aqu no tienen en cuenta lo que podra costar con la ayuda del seguro (puede ser ms barato con su seguro), pero el sitio web puede darle el precio si no utiliz ningn seguro.  - Puede imprimir el cupn correspondiente y llevarlo   con su receta a la farmacia.  - Tambin puede pasar por nuestra oficina durante el horario de atencin regular y recoger una tarjeta de cupones de GoodRx.  - Si necesita que su receta se enve electrnicamente a una farmacia diferente, informe a nuestra oficina a travs de MyChart de [place] o por telfono llamando al [phone] y presione la opcin 4.  

## 2022-03-05 NOTE — Progress Notes (Signed)
   Follow-Up Visit   Subjective  Ariel Lawson is a 57 y.o. female who presents for the following: Follow-up (Patient here today for suture removal. ).   The following portions of the chart were reviewed this encounter and updated as appropriate:       Review of Systems:  No other skin or systemic complaints except as noted in HPI or Assessment and Plan.  Objective  Well appearing patient in no apparent distress; mood and affect are within normal limits.  A focused examination was performed including left hip. Relevant physical exam findings are noted in the Assessment and Plan.    Assessment & Plan   Encounter for Removal of Sutures - Incision site at the left hip is clean, dry and intact - Wound cleansed, sutures removed, wound cleansed and steri strips applied.  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 - Patient advised to keep steri-strips dry until they fall off. - Scars remodel for a full year. - Once steri-strips fall off, patient can apply over-the-counter silicone scar cream each night to help with scar remodeling if desired. - Patient advised to call with any concerns or if they notice any new or changing lesions.  Return for TBSE, as scheduled, Hx Dysplastic Nevi.  Graciella Belton, RMA, am acting as scribe for Brendolyn Patty, MD .   Documentation: I have reviewed the above documentation for accuracy and completeness, and I agree with the above.  Brendolyn Patty MD

## 2022-04-03 ENCOUNTER — Ambulatory Visit: Payer: Managed Care, Other (non HMO) | Admitting: Dermatology

## 2022-04-03 VITALS — BP 117/76 | HR 99

## 2022-04-03 DIAGNOSIS — D2271 Melanocytic nevi of right lower limb, including hip: Secondary | ICD-10-CM

## 2022-04-03 DIAGNOSIS — L820 Inflamed seborrheic keratosis: Secondary | ICD-10-CM | POA: Diagnosis not present

## 2022-04-03 DIAGNOSIS — D229 Melanocytic nevi, unspecified: Secondary | ICD-10-CM

## 2022-04-03 DIAGNOSIS — L814 Other melanin hyperpigmentation: Secondary | ICD-10-CM

## 2022-04-03 DIAGNOSIS — R202 Paresthesia of skin: Secondary | ICD-10-CM

## 2022-04-03 DIAGNOSIS — Z86018 Personal history of other benign neoplasm: Secondary | ICD-10-CM

## 2022-04-03 DIAGNOSIS — L821 Other seborrheic keratosis: Secondary | ICD-10-CM | POA: Diagnosis not present

## 2022-04-03 DIAGNOSIS — L578 Other skin changes due to chronic exposure to nonionizing radiation: Secondary | ICD-10-CM

## 2022-04-03 DIAGNOSIS — D225 Melanocytic nevi of trunk: Secondary | ICD-10-CM | POA: Diagnosis not present

## 2022-04-03 NOTE — Patient Instructions (Addendum)
Clobetasol Ointment - Spot treat itchy areas on back once to twice daily. Avoid face, groin, axilla.  Topical steroids (such as triamcinolone, fluocinolone, fluocinonide, mometasone, clobetasol, halobetasol, betamethasone, hydrocortisone) can cause thinning and lightening of the skin if they are used for too long in the same area. Your physician has selected the right strength medicine for your problem and area affected on the body. Please use your medication only as directed by your physician to prevent side effects.    Cryotherapy Aftercare  Wash gently with soap and water everyday.   Apply Vaseline and Band-Aid daily until healed.   Notalgia paresthetica is a chronic condition affecting the skin of the back in which a pinched nerve along the spine causes itching or changes in sensation in an area of skin. This is usually accompanied by chronic rubbing or scratching often leaving the area of skin discolored and thickened. There is no cure, but there are some treatments which may help control the itch.   Over the counter (non-prescription) treatments for notalgia paresthetica include numbing creams like pramoxine or lidocaine which temporarily reduce itch or Capsaicin-containing creams which cause a burning sensation but which sometimes over time will reset the nerves to stop producing itch.  If you choose to use Capsaicin cream, it is recommended to use it 5 times daily for 1 week followed by 3 times daily for 3-6 weeks. You may have to continue using it long-term. - If not doing well with OTC options, could consider [employer] compounded prescription anti-itch cream with Amitriptyline 5% / Lidocaine 5% / Pramoxine 1% or Amitriptyline 5% / Gabapentin 10% / Lidocaine 5% Cream. For severe cases, there are some prescription cream or pill options which may help. Other treatment options include: - Transcutaneous Electrical Nerve Stimulation (TENS) - Gabapentin 300-900 mg daily po - Amitriptyline  orally - Paravertebral local anesthetic block - intralesional Botulinum toxin A   Seborrheic Keratosis  What causes seborrheic keratoses? Seborrheic keratoses are harmless, common skin growths that first appear during adult life.  As time goes by, more growths appear.  Some people may develop a large number of them.  Seborrheic keratoses appear on both covered and uncovered body parts.  They are not caused by sunlight.  The tendency to develop seborrheic keratoses can be inherited.  They vary in color from skin-colored to gray, brown, or even black.  They can be either smooth or have a rough, warty surface.   Seborrheic keratoses are superficial and look as if they were stuck on the skin.  Under the microscope this type of keratosis looks like layers upon layers of skin.  That is why at times the top layer may seem to fall off, but the rest of the growth remains and re-grows.    Treatment Seborrheic keratoses do not need to be treated, but can easily be removed in the office.  Seborrheic keratoses often cause symptoms when they rub on clothing or jewelry.  Lesions can be in the way of shaving.  If they become inflamed, they can cause itching, soreness, or burning.  Removal of a seborrheic keratosis can be accomplished by freezing, burning, or surgery. If any spot bleeds, scabs, or grows rapidly, please return to have it checked, as these can be an indication of a skin cancer.   Melanoma ABCDEs  Melanoma is the most dangerous type of skin cancer, and is the leading cause of death from skin disease.  You are more likely to develop melanoma if you: Have light-colored  skin, light-colored eyes, or red or blond hair Spend a lot of time in the sun Tan regularly, either outdoors or in a tanning bed Have had blistering sunburns, especially during childhood Have a close family member who has had a melanoma Have atypical moles or large birthmarks  Early detection of melanoma is key since treatment is  typically straightforward and cure rates are extremely high if we catch it early.   The first sign of melanoma is often a change in a mole or a new dark spot.  The ABCDE system is a way of remembering the signs of melanoma.  A for asymmetry:  The two halves do not match. B for border:  The edges of the growth are irregular. C for color:  A mixture of colors are present instead of an even brown color. D for diameter:  Melanomas are usually (but not always) greater than 6m - the size of a pencil eraser. E for evolution:  The spot keeps changing in size, shape, and color.  Please check your skin once per month between visits. You can use a small mirror in front and a large mirror behind you to keep an eye on the back side or your body.   If you see any new or changing lesions before your next follow-up, please call to schedule a visit.  Please continue daily skin protection including broad spectrum sunscreen SPF 30+ to sun-exposed areas, reapplying every 2 hours as needed when you're outdoors.   Staying in the shade or wearing long sleeves, sun glasses (UVA+UVB protection) and wide brim hats (4-inch brim around the entire circumference of the hat) are also recommended for sun protection.     Due to recent changes in healthcare laws, you may see results of your pathology and/or laboratory studies on MyChart before the doctors have had a chance to review them. We understand that in some cases there may be results that are confusing or concerning to you. Please understand that not all results are received at the same time and often the doctors may need to interpret multiple results in order to provide you with the best plan of care or course of treatment. Therefore, we ask that you please give u[place]2 business days to thoroughly review all your results before contacting the office for clarification. Should we see a critical lab result, you will be contacted sooner.   If You Need Anything After Your  Visit  If you have any questions or concerns for your doctor, please call our main line at [phone] press option 4 to reach your doctor's medical assistant. If no one answers, please leave a voicemail as directed and we will return your call as soon as possible. Messages left after 4 pm will be answered the following business day.   You may also send [place] message via [facility] [place] We typically respond to MyChart messages within 1-2 business days.  For prescription refills, please ask your pharmacy to contact our office. Our fax number is [phone]  If you have an urgent issue when the clinic is closed that cannot wait until the next business day, you can page your doctor at the number below.    Please note that while we do our best to be available for urgent issues outside of office hours, we are not available 24/7.   If you have an urgent issue and are unable to reach [place] you may choose to seek medical care at your doctor's office, retail clinic, urgent  care center, or emergency room.  If you have a medical emergency, please immediately call 911 or go to the emergency department.  Pager Numbers  - Dr. Nehemiah Massed: [phone]  - Dr. Laurence Ferrari: [phone]  - Dr. Nicole Kindred: [phone]  In the event of inclement weather, please call our main line at [phone] for an update on the status of any delays or closures.  Dermatology Medication Tips: Please keep the boxes that topical medications come in in order to help keep track of the instructions about where and how to use these. Pharmacies typically print the medication instructions only on the boxes and not directly on the medication tubes.   If your medication is too expensive, please contact our office at [phone] option 4 or send [place] a message through [place] [facility].   We are unable to tell what your co-pay for medications will be in advance as this is different depending on your insurance coverage. However, we may be able to find a  substitute medication at lower cost or fill out paperwork to get insurance to cover a needed medication.   If a prior authorization is required to get your medication covered by your insurance company, please allow [place] 1-2 business days to complete this process.  Drug prices often vary depending on where the prescription is filled and some pharmacies may offer cheaper prices.  The website www.goodrx.com contains coupons for medications through different pharmacies. The prices here do not account for what the cost may be with help from insurance (it may be cheaper with your insurance), but the website can give you the price if you did not use any insurance.  - You can print the associated coupon and take it with your prescription to the pharmacy.  - You may also stop by our office during regular business hours and pick up a GoodRx coupon card.  - If you need your prescription sent electronically to a different pharmacy, notify our office through [facility] or by phone at [phone] option 4.     Si Usted Necesita Algo Despus de Su Visita  Tambin puede enviarnos un mensaje a travs de Pharmacist, community. Por lo general respondemos a los mensajes de MyChart en el transcurso de 1 a 2 das hbiles.  Para renovar recetas, por favor pida a su farmacia que se ponga en contacto con nuestra oficina. Harland Dingwall de fax es [place] [phone].  Si tiene un asunto urgente cuando la clnica est cerrada y que no puede esperar hasta el siguiente da hbil, puede llamar/localizar a su doctor(a) al nmero que aparece a continuacin.   Por favor, tenga en cuenta que aunque hacemos todo lo posible para estar disponibles para asuntos urgentes fuera del horario de [place], no estamos disponibles las 24 horas del da, los 7 das de la [place].   Si tiene un problema urgente y no puede comunicarse con nosotros, puede optar por buscar atencin mdica  en el consultorio de su doctor(a), en una clnica privada, en un  centro de atencin urgente o en una sala de emergencias.  Si tiene Engineering geologist, por favor llame inmediatamente al 911 o vaya a la sala de emergencias.  Nmeros de bper  - Dr. Nehemiah Massed: [phone]  - Dra. Moye: [phone]  - Dra. Nicole Kindred: [phone]  En caso de inclemencias del [place], por favor llame a Johnsie Kindred principal al [phone] para una actualizacin sobre el [place] de cualquier retraso o cierre.  Consejos para la medicacin en dermatologa: Por favor, guarde las cajas en  las que vienen los medicamentos de uso tpico para ayudarle a seguir las instrucciones sobre dnde y cmo usarlos. Las farmacias generalmente imprimen las instrucciones del medicamento slo en las cajas y no directamente en los tubos del [place].   Si su medicamento es muy caro, por favor, pngase en contacto con Zigmund Daniel llamando al [phone] y presione la opcin 4 o envenos un mensaje a travs de Pharmacist, community.   No podemos decirle cul ser su copago por los medicamentos por adelantado ya que esto es diferente dependiendo de la cobertura de su seguro. Sin embargo, es posible que podamos encontrar un medicamento sustituto a Electrical engineer un formulario para que el seguro cubra el medicamento que se considera necesario.   Si se requiere una autorizacin previa para que su compaa de seguros [place] su medicamento, por favor permtanos de 1 a 2 das hbiles para completar este proceso.  Los precios de los medicamentos varan con frecuencia dependiendo del Environmental consultant de dnde se surte la receta y alguna farmacias pueden ofrecer precios ms baratos.  El sitio web www.goodrx.com tiene cupones para medicamentos de Airline pilot. Los precios aqu no tienen en cuenta lo que podra costar con la ayuda del seguro (puede ser ms barato con su seguro), pero el sitio web puede darle el precio si no utiliz Research scientist (physical sciences).  - Puede imprimir el cupn correspondiente y llevarlo con su receta  a la farmacia.  - Tambin puede pasar por nuestra oficina durante el horario de atencin regular y Charity fundraiser una tarjeta de cupones de GoodRx.  - Si necesita que su receta se enve electrnicamente a una farmacia diferente, informe a nuestra oficina a travs de MyChart de [place] o por telfono llamando al [phone] y presione la opcin 4.

## 2022-04-03 NOTE — Progress Notes (Signed)
Follow-Up Visit   Subjective  Ariel Lawson is a 57 y.o. female who presents for the following: Annual Exam.  The patient presents for Total-Body Skin Exam (TBSE) for skin cancer screening and mole check.  The patient has spots, moles and lesions to be evaluated, some may be new or changing. She has a history of multiple dysplastic nevi.    The following portions of the chart were reviewed this encounter and updated as appropriate:       Review of Systems:  No other skin or systemic complaints except as noted in HPI or Assessment and Plan.  Objective  Well appearing patient in no apparent distress; mood and affect are within normal limits.  A full examination was performed including scalp, head, eyes, ears, nose, lips, neck, chest, axillae, abdomen, back, buttocks, bilateral upper extremities, bilateral lower extremities, hands, feet, fingers, toes, fingernails, and toenails. All findings within normal limits unless otherwise noted below.  Left Lower Back; Right Lateral Mid Back; R med thigh 5.29m regular brown macule at left lower back   5 x 2.512mbrown macule lighter center right lateral mid back  6.0 x 3.0 mm med brown papule right med thigh    R posterior shoulder 2.5 mm gray brown waxy papule, no changes when compared to photo   Mid Back x 6 (6) Erythematous stuck-on, waxy papules  mid back Clear, still itching    Assessment & Plan   Actinic Damage - chronic, secondary to cumulative UV radiation exposure/sun exposure over time - diffuse scaly erythematous macules with underlying dyspigmentation - Recommend daily broad spectrum sunscreen SPF 30+ to sun-exposed areas, reapply every 2 hours as needed.  - Recommend staying in the shade or wearing long sleeves, sun glasses (UVA+UVB protection) and wide brim hats (4-inch brim around the entire circumference of the hat). - Call for new or changing lesions.  Lentigines - Scattered tan macules - Due to sun  exposure - Benign-appearing, observe - Recommend daily broad spectrum sunscreen SPF 30+ to sun-exposed areas, reapply every 2 hours as needed. - Call for any changes  Seborrheic Keratoses - Stuck-on, waxy, tan-brown papules and/or plaques, including left supra auricular scalp  - Benign-appearing - Discussed benign etiology and prognosis. - Observe - Call for any changes  Melanocytic Nevi - Tan-brown and/or pink-flesh-colored symmetric macules and papules, including pink flesh papule of the occipital scalp - Benign appearing on exam today - Observation - Call clinic for new or changing moles - Recommend daily use of broad spectrum spf 30+ sunscreen to sun-exposed areas.   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  - Call if any new or changing lesions are noted between office visits  Hemangiomas - Red papules - Discussed benign nature - Observe - Call for any changes  Nevus Left Lower Back; Right Lateral Mid Back; R med thigh  Benign-appearing, stable.  Observation.  Call clinic for new or changing moles.  Recommend daily use of broad spectrum spf 30+ sunscreen to sun-exposed areas.   Seborrheic keratosis R posterior shoulder  Vs Nevus.  Benign appearing, stable compared to photo. Observation.   Inflamed seborrheic keratosis (6) Mid Back x 6  Symptomatic, irritating, patient would like treated. Very Itchy.   May spot treat clobetasol ointment QD/BID prn itch. Avoid face, groin, axilla.   Topical steroids (such as triamcinolone, fluocinolone,  fluocinonide, mometasone, clobetasol, halobetasol, betamethasone, hydrocortisone) can cause thinning and lightening of the skin if they are used for too long in the same area. Your physician has selected the right strength medicine for your problem and area affected  on the body. Please use your medication only as directed by your physician to prevent side effects.    Destruction of lesion - Mid Back x 6  Destruction method: cryotherapy   Informed consent: discussed and consent obtained   Lesion destroyed using liquid nitrogen: Yes   Region frozen until ice ball extended beyond lesion: Yes   Outcome: patient tolerated procedure well with no complications   Post-procedure details: wound care instructions given   Additional details:  Prior to procedure, discussed risks of blister formation, small wound, skin dyspigmentation, or rare scar following cryotherapy. Recommend Vaseline ointment to treated areas while healing.   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 Condition is symptomatic/ bothersome to patient. Not currently at goal.   Pt will try clobetasol ointment qd/bid to aa back prn itch  Notalgia paresthetica is a chronic condition affecting the skin of the back in which a pinched nerve along the spine causes itching or changes in sensation in an area of skin. This is usually accompanied by chronic rubbing or scratching often leaving the area of skin discolored and thickened. There is no cure, but there are some treatments which may help control the itch.   Over the counter (non-prescription) treatments for notalgia paresthetica include numbing creams like pramoxine or lidocaine which temporarily reduce itch or Capsaicin-containing creams which cause a burning sensation but which sometimes over time will reset the nerves to stop producing itch.  If you choose to use Capsaicin cream, it is recommended to use it 5 times daily for 1 week followed by 3 times daily for 3-6 weeks. You may have to continue using it long-term. - If not doing well with OTC options, could consider [facility] [employer] compounded prescription anti-itch cream with Amitriptyline 5% / Lidocaine 5% / Pramoxine 1% or  Amitriptyline 5% / Gabapentin 10% / Lidocaine 5% Cream. For severe cases, there are some prescription cream or pill options which may help. Other treatment options include: - Transcutaneous Electrical Nerve Stimulation (TENS) - Gabapentin 300-900 mg daily po - Amitriptyline orally - Paravertebral local anesthetic block - intralesional Botulinum toxin A   Return in about 1 year (around 04/04/2023) for TBSE, Hx Dysplastic Nevus.  IJamesetta Orleans, CMA, am acting as scribe for Brendolyn Patty, MD .  Documentation: I have reviewed the above documentation for accuracy and completeness, and I agree with the above.  Brendolyn Patty MD

## 2022-05-15 ENCOUNTER — Other Ambulatory Visit: Payer: Self-pay | Admitting: Sports Medicine

## 2022-05-15 DIAGNOSIS — W19XXXA Unspecified fall, initial encounter: Secondary | ICD-10-CM

## 2022-05-15 DIAGNOSIS — M533 Sacrococcygeal disorders, not elsewhere classified: Secondary | ICD-10-CM

## 2022-05-15 DIAGNOSIS — S300XXA Contusion of lower back and pelvis, initial encounter: Secondary | ICD-10-CM

## 2022-05-19 ENCOUNTER — Ambulatory Visit
Admission: RE | Admit: 2022-05-19 | Discharge: 2022-05-19 | Disposition: A | Discharge status: Home / Self Care | Payer: Managed Care, Other (non HMO) | Source: Ambulatory Visit | Attending: Sports Medicine | Admitting: Sports Medicine

## 2022-05-19 DIAGNOSIS — M533 Sacrococcygeal disorders, not elsewhere classified: Secondary | ICD-10-CM

## 2022-05-19 DIAGNOSIS — W19XXXA Unspecified fall, initial encounter: Secondary | ICD-10-CM

## 2022-05-19 DIAGNOSIS — S300XXA Contusion of lower back and pelvis, initial encounter: Secondary | ICD-10-CM

## 2022-09-21 ENCOUNTER — Other Ambulatory Visit: Payer: Self-pay | Admitting: Family Medicine

## 2022-09-21 DIAGNOSIS — R450 Nervousness: Secondary | ICD-10-CM

## 2022-10-16 ENCOUNTER — Encounter: Payer: Self-pay | Admitting: Family Medicine

## 2022-10-16 ENCOUNTER — Ambulatory Visit: Payer: Managed Care, Other (non HMO) | Admitting: Family Medicine

## 2022-10-16 VITALS — BP 107/70 | HR 68 | Ht 64.0 in | Wt 182.0 lb

## 2022-10-16 DIAGNOSIS — E669 Obesity, unspecified: Secondary | ICD-10-CM

## 2022-10-16 DIAGNOSIS — Z2821 Immunization not carried out because of patient refusal: Secondary | ICD-10-CM | POA: Diagnosis not present

## 2022-10-16 DIAGNOSIS — D509 Iron deficiency anemia, unspecified: Secondary | ICD-10-CM

## 2022-10-16 DIAGNOSIS — Z0000 Encounter for general adult medical examination without abnormal findings: Secondary | ICD-10-CM | POA: Diagnosis not present

## 2022-10-16 DIAGNOSIS — Z1211 Encounter for screening for malignant neoplasm of colon: Secondary | ICD-10-CM

## 2022-10-16 DIAGNOSIS — F439 Reaction to severe stress, unspecified: Secondary | ICD-10-CM

## 2022-10-17 LAB — CBC WITH DIFFERENTIAL/PLATELET
Basophils Absolute: 0 10*3/uL (ref 0.0–0.2)
Basos: 1 %
EOS (ABSOLUTE): 0.1 10*3/uL (ref 0.0–0.4)
Eos: 2 %
Hematocrit: 42.4 % (ref 34.0–46.6)
Hemoglobin: 13.8 g/dL (ref 11.1–15.9)
Immature Grans (Abs): 0 10*3/uL (ref 0.0–0.1)
Immature Granulocytes: 0 %
Lymphocytes Absolute: 2 10*3/uL (ref 0.7–3.1)
Lymphs: 33 %
MCH: 29.9 pg (ref 26.6–33.0)
MCHC: 32.5 g/dL (ref 31.5–35.7)
MCV: 92 fL (ref 79–97)
Monocytes Absolute: 0.4 10*3/uL (ref 0.1–0.9)
Monocytes: 6 %
Neutrophils Absolute: 3.6 10*3/uL (ref 1.4–7.0)
Neutrophils: 58 %
Platelets: 232 10*3/uL (ref 150–450)
RBC: 4.62 x10E6/uL (ref 3.77–5.28)
RDW: 13.1 % (ref 11.7–15.4)
WBC: 6.2 10*3/uL (ref 3.4–10.8)

## 2022-10-17 LAB — LIPID PANEL WITH LDL/HDL RATIO
Cholesterol, Total: 176 mg/dL (ref 100–199)
HDL: 65 mg/dL (ref >39)
LDL Chol Calc (NIH): 103 mg/dL — ABNORMAL HIGH (ref 0–99)
LDL/HDL Ratio: 1.6 ratio (ref 0.0–3.2)
Triglycerides: 37 mg/dL (ref 0–149)
VLDL Cholesterol Cal: 8 mg/dL (ref 5–40)

## 2022-10-17 LAB — COMPREHENSIVE METABOLIC PANEL
ALT: 11 IU/L (ref 0–32)
AST: 13 IU/L (ref 0–40)
Albumin: 4.3 g/dL (ref 3.8–4.9)
Alkaline Phosphatase: 80 IU/L (ref 44–121)
BUN/Creatinine Ratio: 17 (ref 9–23)
BUN: 14 mg/dL (ref 6–24)
Bilirubin Total: 0.3 mg/dL (ref 0.0–1.2)
CO2: 23 mmol/L (ref 20–29)
Calcium: 9.3 mg/dL (ref 8.7–10.2)
Chloride: 105 mmol/L (ref 96–106)
Creatinine, Ser: 0.83 mg/dL (ref 0.57–1.00)
Globulin, Total: 2.1 g/dL (ref 1.5–4.5)
Glucose: 84 mg/dL (ref 70–99)
Potassium: 4.5 mmol/L (ref 3.5–5.2)
Sodium: 142 mmol/L (ref 134–144)
Total Protein: 6.4 g/dL (ref 6.0–8.5)
eGFR: 83 mL/min/{1.73_m2} (ref >59)

## 2022-10-17 LAB — T4, FREE: Free T4: 1.17 ng/dL (ref 0.82–1.77)

## 2022-10-17 LAB — VITAMIN D 25 HYDROXY (VIT D DEFICIENCY, FRACTURES): Vit D, 25-Hydroxy: 51.5 ng/mL (ref 30.0–100.0)

## 2022-10-17 LAB — FERRITIN: Ferritin: 27 ng/mL (ref 15–150)

## 2022-10-17 LAB — T3, FREE: T3, Free: 3 pg/mL (ref 2.0–4.4)

## 2022-10-17 LAB — TSH: TSH: 1.69 u[IU]/mL (ref 0.450–4.500)

## 2022-10-25 NOTE — Progress Notes (Signed)
Complete physical exam   Patient: Ariel Lawson   DOB: 09-07-1965   57 y.o. Female  MRN: [id number] Visit Date: 10/16/2022  Today's healthcare provider: Mila Merry, MD   Chief Complaint  Patient presents with   Annual Exam    Diet - Gluten free,all organic, well balanced Exercise - 4xs a week for one hour and sometimes longer Feeling - well Sleeping - Well Concerns - none   Subjective    Ariel Lawson is a 57 y.o. female who presents today for a complete physical exam.  She reports consuming a  health  diet.  She generally feels fairly well. She reports sleeping fairly well. She does not have additional problems to discuss today.  She Is exercising several times a week. Is having a lot of stress and takes occasionally alprazolam which is effective.   She has gyn ,dermatologist and ophthalmologist that she sees yearly.   Past Medical History:  Diagnosis Date   Anemia    IDA   Dysplastic nevus 06/17/2018   left paraspinal lower back, mod atypia    Dysplastic nevus 09/02/2017   right posterior shoulder severe atypia excised    Dysplastic nevus 09/18/2006   Right ant thigh mod to marked atypia excised    Dysplastic nevus 03/21/2020   L hip, mild atypia, excised 02/26/22   Family history of lung cancer    Family history of stomach cancer    Family history of thyroid cancer    Family history of uterine cancer    Fibroids 2015   uterus- cyst   Herpes simplex    History of chicken pox    Ovarian cyst    right   Past Surgical History:  Procedure Laterality Date   LEEP  2007   Leep cone Biopsy of Cervix   screning mammogram  09/2012   ordered through [place] employer per pt report   Social History   Socioeconomic History   Marital status: Married    Spouse name: Not on file   Number of children: 4   Years of education: Not on file   Highest education level: Not on file  Occupational History   Not on file  Tobacco Use   Smoking status: Never    Smokeless tobacco: Never  Substance and Sexual Activity   Alcohol use: No    Alcohol/week: 0.0 standard drinks of alcohol   Drug use: No   Sexual activity: Yes  Other Topics Concern   Not on file  Social History Narrative   Not on file   Social Determinants of Health   Financial Resource Strain: Not on file  Food Insecurity: Not on file  Transportation Needs: Not on file  Physical Activity: Not on file  Stress: Not on file  Social Connections: Not on file  Intimate Partner Violence: Not on file   Family Status  Relation Name Status   Mother  Alive       Myxedema   Father  Deceased       lung cancer   Sister  Alive   Sister  Alive   Mat Aunt  Deceased   Mat Uncle  Deceased   MGF  (Not Specified)   PGM  (Not Specified)   Other MGaunt Deceased   Other MGGM Deceased  No partnership data on file   Family History  Problem Relation Age of Onset   Hypertension Mother    Diabetes Mother        type 2  Hyperlipidemia Mother    Anemia Mother    Lung cancer Father 65   Thyroid cancer Sister 19   Melanoma Sister 51       in remission x 8 years.    Hypothyroidism Sister    Thyroid cancer Sister 54   Hypothyroidism Sister    Uterine cancer Maternal Aunt 26   Cancer Maternal Uncle 41       'very rare type of cancer' pt does not know the name of the cancer type   Melanoma Maternal Grandfather 68   Cancer Paternal Grandmother 60       mouth   Stomach cancer Other 55   Stomach cancer Other 22   Allergies  Allergen Reactions   Prevacid  [Lansoprazole] Itching    Patient Care Team: Malva Limes, MD as PCP - General (Family Medicine) Willeen Niece, MD (Dermatology) Eileen Stanford, MD as Referring Physician (Allergy and Immunology) Nadara Mustard, MD as Referring Physician (Obstetrics and Gynecology) Isla Pence, OD (Optometry)   Medications: Outpatient Medications Prior to Visit  Medication Sig   ALPRAZolam (XANAX) 0.25 MG tablet TAKE 0.5-1 TABLETS  (0.125-0.25 MG TOTAL) BY MOUTH 2 (TWO) TIMES DAILY AS NEEDED FOR ANXIETY.   Cholecalciferol (VITAMIN D) 50 MCG (2000 UT) CAPS Take 1 capsule by mouth daily.   Ferrous Sulfate (IRON PO) Take 30 mg by mouth. Lisette Grinder Iron 30 mg superior absorbent 2 per day   amoxicillin-clavulanate (AUGMENTIN) 250-62.5 MG/5ML suspension Take 5 mLs (250 mg total) by mouth 3 (three) times daily. (Patient not taking: Reported on 10/16/2022)   clobetasol ointment (TEMOVATE) 0.05 % Apply topically to aa left hip qd/bid prn for itch. Avoid applying to face, groin, and axilla. Use as directed. (Patient not taking: Reported on 10/16/2022)   Magnesium Gluconate (MAGNESIUM 27 PO) Take 1 tablet by mouth daily. (Patient not taking: Reported on 10/16/2022)   POLY-IRON 150 FORTE 150-25-1 MG-MCG-MG CAPS Take 1 capsule by mouth 2 (two) times daily. (Patient not taking: Reported on 10/16/2022)   No facility-administered medications prior to visit.    Review of Systems  Constitutional:  Negative for chills, diaphoresis and fever.  HENT:  Negative for congestion, ear discharge, ear pain, hearing loss, nosebleeds, sore throat and tinnitus.   Eyes:  Negative for photophobia, pain, discharge and redness.  Respiratory:  Negative for cough, shortness of breath, wheezing and stridor.   Cardiovascular:  Negative for chest pain, palpitations and leg swelling.  Gastrointestinal:  Negative for abdominal pain, blood in stool, constipation, diarrhea, nausea and vomiting.  Endocrine: Negative for polydipsia.  Genitourinary:  Negative for dysuria, flank pain, frequency, hematuria and urgency.  Musculoskeletal:  Negative for back pain, myalgias and neck pain.  Skin:  Negative for rash.  Allergic/Immunologic: Negative for environmental allergies.  Neurological:  Negative for dizziness, tremors, seizures, weakness and headaches.  Hematological:  Does not bruise/bleed easily.  Psychiatric/Behavioral:  Negative for hallucinations and suicidal ideas.        Objective    BP 107/70 (BP Location: Left Arm, Patient Position: Sitting, Cuff Size: Normal)   Pulse 68   Ht 5\' 4"  (1.626 m)   Wt 182 lb (82.6 kg)   BMI 31.24 kg/m    Physical Exam   General Appearance:    Mildly obese female. Alert, cooperative, in no acute distress, appears stated age   Head:    Normocephalic, without obvious abnormality, atraumatic  Eyes:    PERRL, conjunctiva/corneas clear, EOM's intact, fundi    benign, both eyes  Ears:    Normal TM's and external ear canals, both ears  Nose:   Nares normal, septum midline, mucosa normal, no drainage    or sinus tenderness  Throat:   Lips, mucosa, and tongue normal; teeth and gums normal  Neck:   Supple, symmetrical, trachea midline, no adenopathy;    thyroid:  no enlargement/tenderness/nodules; no carotid   bruit or JVD  Back:     Symmetric, no curvature, ROM normal, no CVA tenderness  Lungs:     Clear to auscultation bilaterally, respirations unlabored  Chest Wall:    No tenderness or deformity   Heart:    Normal heart rate. Normal rhythm. No murmurs, rubs, or gallops.   Breast Exam:     Deferred t gyn  Abdomen:     Soft, non-tender, bowel sounds active all four quadrants,    no masses, no organomegaly  Pelvic:      Extremities:   All extremities are intact. No cyanosis or edema  Pulses:   2+ and symmetric all extremities  Skin:   Skin color, texture, turgor normal, no rashes or lesions  Lymph nodes:   Cervical, supraclavicular, and axillary nodes normal  Neurologic:   CNII-XII intact, normal strength, sensation and reflexes    throughout     Last depression screening scores    10/16/2022   11:45 AM 02/23/2022    2:23 PM 09/05/2021    2:06 PM  PHQ 2/9 Scores  PHQ - 2 Score 0 1 0  PHQ- 9 Score  4    Last fall risk screening    10/16/2022   11:45 AM  Fall Risk   Falls in the past year? 0  Injury with Fall? 0  Risk for fall due to : No Fall Risks  Follow up Falls evaluation completed   Last Audit-C  alcohol use screening    02/23/2022    2:23 PM  Alcohol Use Disorder Test (AUDIT)  1. How often do you have a drink containing alcohol? 0  3. How often do you have six or more drinks on one occasion? 0   A score of 3 or more in women, and 4 or more in men indicates increased risk for alcohol abuse, EXCEPT if all of the points are from question 1     Assessment & Plan    Routine Health Maintenance and Physical Exam  Exercise Activities and Dietary recommendations  Goals   None      There is no immunization history on file for this patient.  Health Maintenance  Topic Date Due   DTaP/Tdap/Td (1 - Tdap) Never done   Zoster Vaccines- Shingrix (1 of 2) Never done   Fecal DNA (Cologuard)  08/12/2022   MAMMOGRAM  06/22/2023   Cervical Cancer Screening (HPV/Pap Cotest)  05/04/2026   Hepatitis C Screening  Completed   HIV Screening  Completed   HPV VACCINES  Aged Out   COVID-19 Vaccine  Discontinued    Discussed health benefits of physical activity, and encouraged her to engage in regular exercise appropriate for her age and condition.   2. Screening for colon cancer  - Cologuard  3. Influenza vaccination declined by patient   4. Tetanus, diphtheria, and acellular pertussis (Tdap) vaccination declined   5. Obesity, unspecified classification, unspecified obesity type, unspecified whether serious comorbidity present  - T4, free - TSH - T3, free  6. Iron deficiency anemia, unspecified iron deficiency anemia type  - CBC with Differential/Platelet - Ferritin  7. Situational  stress  - Comprehensive metabolic panel - Lipid Panel With LDL/HDL Ratio - VITAMIN D 25 Hydroxy (Vit-D Deficiency, Fractures)       Mila Merry, MD  [facility] [phone] (phone) [phone] (fax)  [facility]

## 2022-11-05 ENCOUNTER — Telehealth: Payer: Self-pay | Admitting: Family Medicine

## 2022-11-05 NOTE — Telephone Encounter (Signed)
Pt is calling to follow up on the biometric screening that was dropped of on 10/16/2022. Form needs to be faxed back to the number on the form. Please advise CB- [phone]

## 2022-11-05 NOTE — Telephone Encounter (Signed)
I filled it out a few weeks ago and sent to medical records to be scanned and faxed. Don't know if it was done.

## 2022-11-09 LAB — COLOGUARD: COLOGUARD: NEGATIVE

## 2022-11-26 IMAGING — MG MM DIGITAL SCREENING BILAT W/ TOMO AND CAD
6 of 12 series · 6 of 36 positions shown · non-contrast
Comparison: Previous exam(s).

CLINICAL DATA: Screening.

EXAM:
DIGITAL SCREENING BILATERAL MAMMOGRAM WITH TOMOSYNTHESIS AND CAD
TECHNIQUE: Bilateral screening digital craniocaudal and mediolateral oblique
mammograms were obtained. Bilateral screening digital breast
tomosynthesis was performed. The images were evaluated with
computer-aided detection.

[L CC synth-2D]
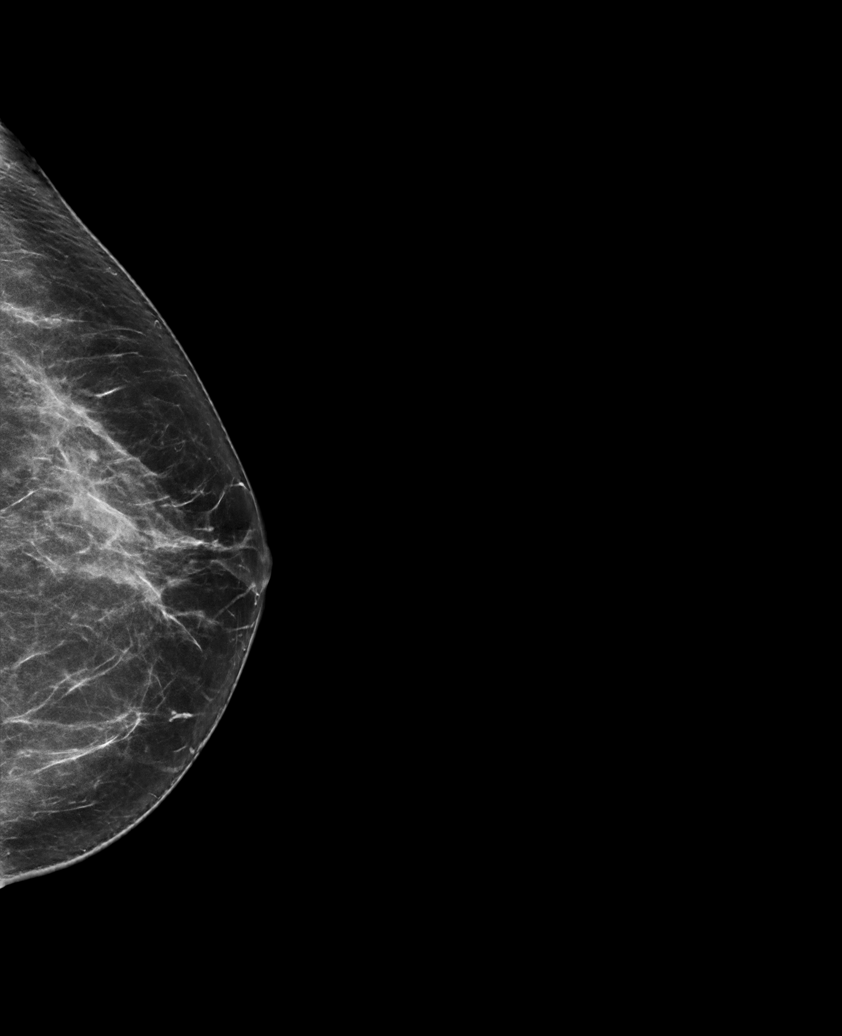

[R CC synth-2D]
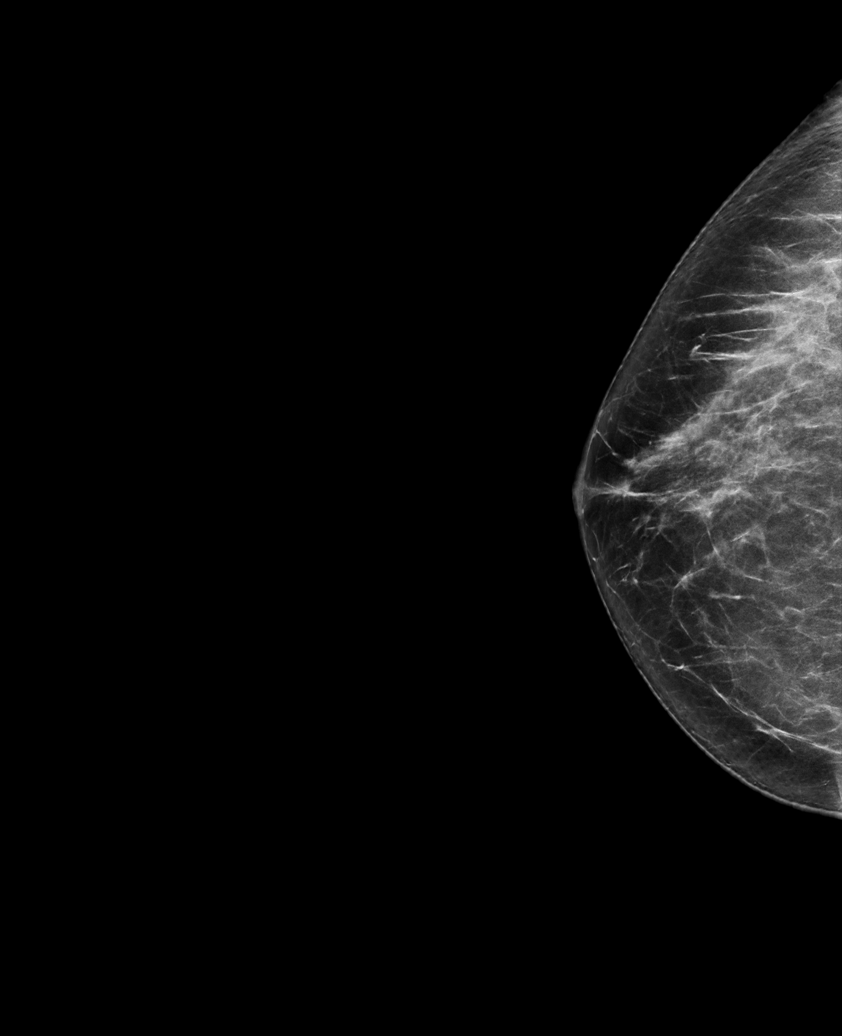

[R MLO synth-2D]
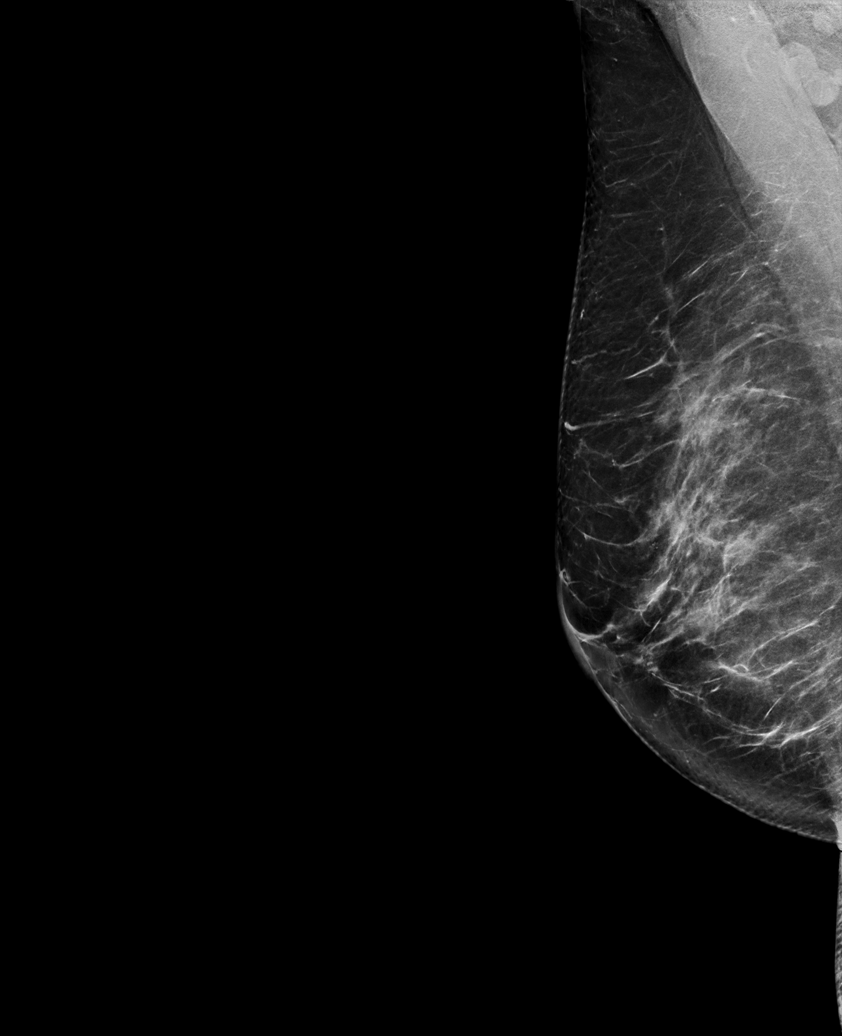

[L MLO synth-2D]
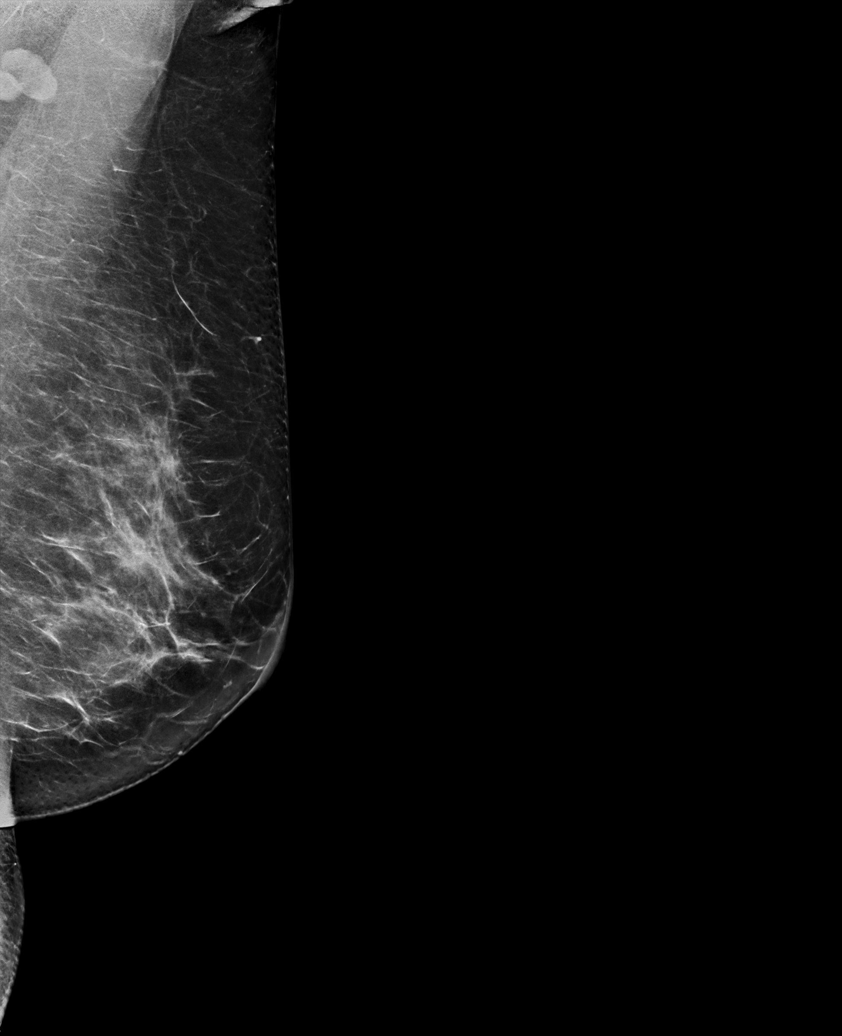

[L XCCL synth-2D]
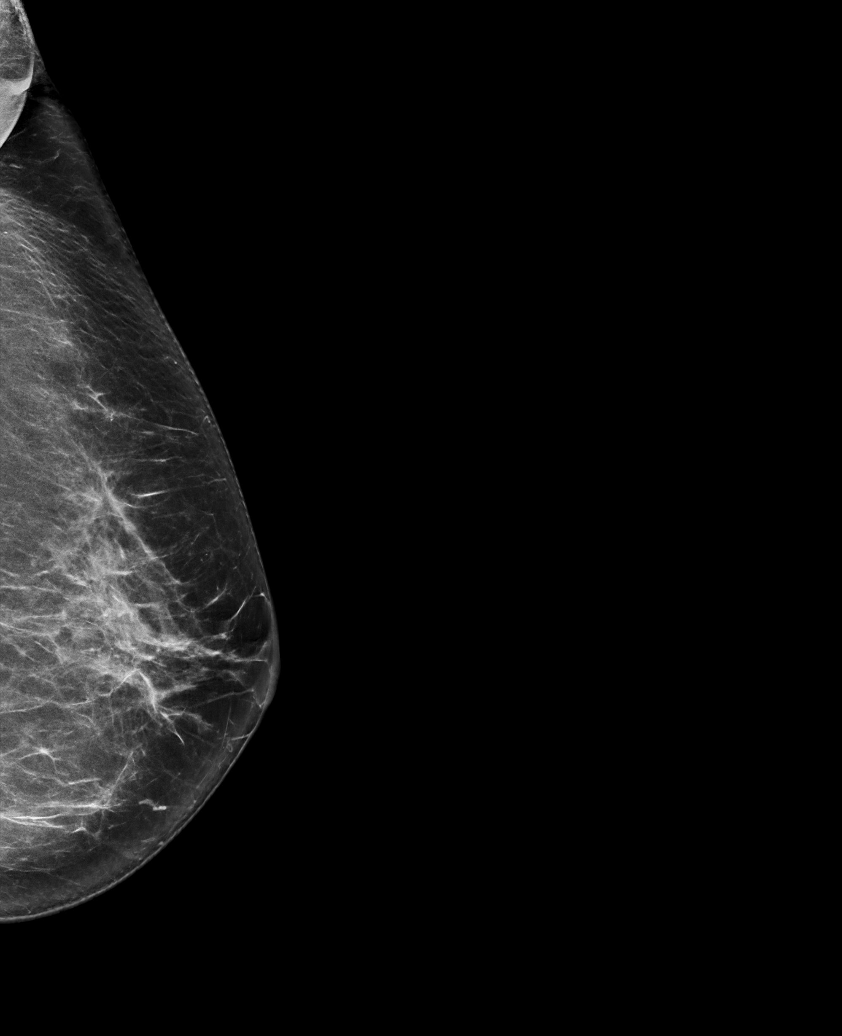

[R XCCL synth-2D]
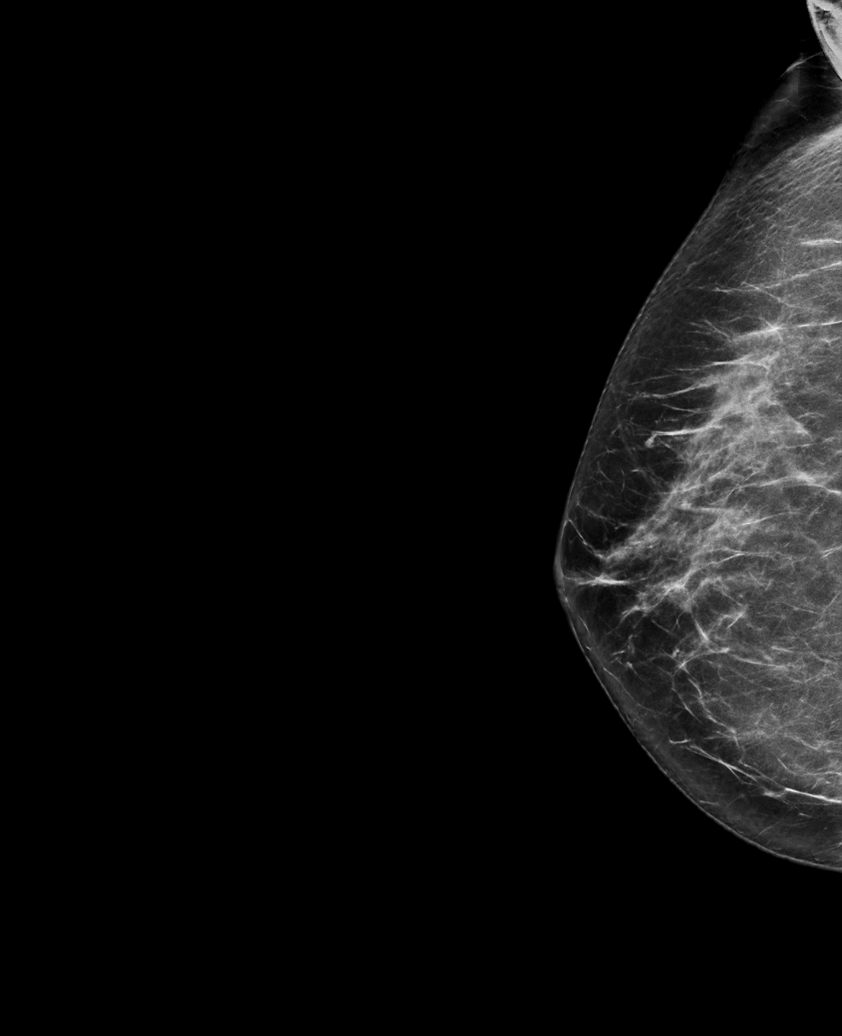

[6 of 36 positions shown; findings below may reference images not displayed]

ACR Breast Density Category c: The breast tissue is heterogeneously
dense, which may obscure small masses.
FINDINGS: There are no findings suspicious for malignancy.
IMPRESSION: No mammographic evidence of malignancy. A result letter of this
screening mammogram will be mailed directly to the patient.

RECOMMENDATION:
Screening mammogram in one year. (Code:Q3-W-BC3)

BI-RADS CATEGORY  1: Negative.

## 2023-01-16 ENCOUNTER — Telehealth: Payer: Self-pay

## 2023-01-16 NOTE — Telephone Encounter (Signed)
Copied from CRM [id number]. Topic: General - Other >> Jan 16, 2023 10:33 AM Macon Large wrote: Reason for CRM: Pt stated that she needs FMLA paperwork completed by next Thursday 01/24/23. Pt asked that a change be made to reflect that days or weeks at a time is needed. Pt stated for the last 3-4 months she has been needing days/weeks at a time so she would like the 2025 FMLA paperwork to stated this. Cb# [phone]

## 2023-01-17 ENCOUNTER — Telehealth: Payer: Self-pay | Admitting: Family Medicine

## 2023-01-17 NOTE — Telephone Encounter (Signed)
FMLA papers being sent back for completion.  Please call patient when ready for pickup.   [phone]

## 2023-01-21 NOTE — Telephone Encounter (Signed)
Pt called this morning saying papers are due by the 19th which is Thursday.  CB@  [phone]  [phone]  leave a message

## 2023-01-22 DIAGNOSIS — Z0279 Encounter for issue of other medical certificate: Secondary | ICD-10-CM

## 2023-01-22 NOTE — Telephone Encounter (Signed)
Need to be more specific. Needs to state estimate of maximum number of days off at a time, and estimate of how many times a month she would need to take those days off. Last years FMLA states she may need to be off 2 days at a time up to 2 times per week.

## 2023-01-23 NOTE — Telephone Encounter (Signed)
Notified patient that forms are ready for pick up.  She gave permission for her daughter Cloteal Dunnell to pick up.  Original left at the front desk for pick up.

## 2023-01-23 NOTE — Telephone Encounter (Signed)
Have not gotten response back from patient. Have changed FMLA to state up to 3 absences per month, up day 5 days per absence and left in tray for medical records.

## 2023-03-26 ENCOUNTER — Other Ambulatory Visit: Payer: Self-pay | Admitting: Family Medicine

## 2023-03-26 DIAGNOSIS — Z1231 Encounter for screening mammogram for malignant neoplasm of breast: Secondary | ICD-10-CM

## 2023-04-09 ENCOUNTER — Ambulatory Visit: Payer: Managed Care, Other (non HMO) | Admitting: Dermatology

## 2023-04-09 DIAGNOSIS — W908XXA Exposure to other nonionizing radiation, initial encounter: Secondary | ICD-10-CM

## 2023-04-09 DIAGNOSIS — L300 Nummular dermatitis: Secondary | ICD-10-CM

## 2023-04-09 DIAGNOSIS — B009 Herpesviral infection, unspecified: Secondary | ICD-10-CM

## 2023-04-09 DIAGNOSIS — L820 Inflamed seborrheic keratosis: Secondary | ICD-10-CM

## 2023-04-09 DIAGNOSIS — Z1283 Encounter for screening for malignant neoplasm of skin: Secondary | ICD-10-CM | POA: Diagnosis not present

## 2023-04-09 DIAGNOSIS — D492 Neoplasm of unspecified behavior of bone, soft tissue, and skin: Secondary | ICD-10-CM

## 2023-04-09 DIAGNOSIS — D2261 Melanocytic nevi of right upper limb, including shoulder: Secondary | ICD-10-CM

## 2023-04-09 DIAGNOSIS — Z808 Family history of malignant neoplasm of other organs or systems: Secondary | ICD-10-CM

## 2023-04-09 DIAGNOSIS — D1801 Hemangioma of skin and subcutaneous tissue: Secondary | ICD-10-CM | POA: Diagnosis not present

## 2023-04-09 DIAGNOSIS — L578 Other skin changes due to chronic exposure to nonionizing radiation: Secondary | ICD-10-CM

## 2023-04-09 DIAGNOSIS — D229 Melanocytic nevi, unspecified: Secondary | ICD-10-CM

## 2023-04-09 DIAGNOSIS — D225 Melanocytic nevi of trunk: Secondary | ICD-10-CM

## 2023-04-09 DIAGNOSIS — D485 Neoplasm of uncertain behavior of skin: Secondary | ICD-10-CM

## 2023-04-09 DIAGNOSIS — L821 Other seborrheic keratosis: Secondary | ICD-10-CM

## 2023-04-09 DIAGNOSIS — Z86018 Personal history of other benign neoplasm: Secondary | ICD-10-CM

## 2023-04-09 DIAGNOSIS — L814 Other melanin hyperpigmentation: Secondary | ICD-10-CM

## 2023-04-09 DIAGNOSIS — D2271 Melanocytic nevi of right lower limb, including hip: Secondary | ICD-10-CM | POA: Diagnosis not present

## 2023-04-09 MED ORDER — OPZELURA 1.5 % EX CREA: TOPICAL_CREAM | CUTANEOUS | 1 refills | Status: AC

## 2023-04-09 NOTE — Progress Notes (Signed)
 Follow-Up Visit   Subjective  Ariel Lawson is a 58 y.o. female who presents for the following: Skin Cancer Screening and Full Body Skin Exam  The patient presents for Total-Body Skin Exam (TBSE) for skin cancer screening and mole check. The patient has spots, moles and lesions to be evaluated, some may be new or changing. She has started water aerobics and has new, itchy spots on her body come up. She has a spot on her left leg that she hits when shaving. History of dysplastic nevi.   The following portions of the chart were reviewed this encounter and updated as appropriate: medications, allergies, medical history  Review of Systems:  No other skin or systemic complaints except as noted in HPI or Assessment and Plan.  Objective  Well appearing patient in no apparent distress; mood and affect are within normal limits.  A full examination was performed including scalp, head, eyes, ears, nose, lips, neck, chest, axillae, abdomen, back, buttocks, bilateral upper extremities, bilateral lower extremities, hands, feet, fingers, toes, fingernails, and toenails. All findings within normal limits unless otherwise noted below.   Relevant physical exam findings are noted in the Assessment and Plan.  Right Popliteal 3.0 mm flesh papule  Left Medial Lower Knee 2.0 mm red papule Right Posterior Shoulder 2.5 mm gray brown waxy papule with darker center  Left Sternal Notch Filiform scaly pink papule, 3.0 mm   Assessment & Plan   SKIN CANCER SCREENING PERFORMED TODAY.  ACTINIC DAMAGE - Chronic condition, secondary to cumulative UV/sun exposure - diffuse scaly erythematous macules with underlying dyspigmentation - Recommend daily broad spectrum sunscreen SPF 30+ to sun-exposed areas, reapply every 2 hours as needed.  - Staying in the shade or wearing long sleeves, sun glasses (UVA+UVB protection) and wide brim hats (4-inch brim around the entire circumference of the hat) are also  recommended for sun protection.  - Call for new or changing lesions.  LENTIGINES, SEBORRHEIC KERATOSES, HEMANGIOMAS - Benign normal skin lesions - Benign-appearing - Call for any changes  MELANOCYTIC NEVI - Tan-brown and/or pink-flesh-colored symmetric macules and papules - 5.50mm regular brown macule at left lower back - 5 x 2.16mm brown macule lighter center right lateral mid back - 6.0 x 3.0 mm med brown papule right med thigh - Benign appearing on exam today - Observation - Call clinic for new or changing moles - Recommend daily use of broad spectrum spf 30+ sunscreen to sun-exposed areas.   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 Condition is bothersome/symptomatic for patient. Currently flared.   Nummular dermatitis (eczema) is a chronic, relapsing, itchy rash that can significantly affect quality of life. It is often associated with dry skin and flares in the wintertime, and may require treatment with prescription topical anti-inflammatory medications, in addition to gentle skin care.  If there is associated atopic dermatitis and topicals are not working, then biologic injections may be necessary to clear rash and control symptoms.  Treatment Plan: Start Opzelura Cream apply to affected areas itchy rash twice daily until improved dsp 60g 1Rf. Rx sent to [facility].   Recommend mild soap and moisturizing cream 1-2 times daily.  Gentle skin care handout provided.    History of Dysplastic Nevi - No evidence of recurrence today - Recommend regular full body skin exams - Recommend daily  broad spectrum sunscreen SPF 30+ to sun-exposed areas, reapply every 2 hours as needed.  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 Condition is bothersome/symptomatic for patient. Currently flared.   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  It is infectious and can be spread from person to person by direct contact.  It is not curable, but is treatable with topical and oral medication.  Treatment Plan Discussed Valtrex treatment. Patient defers today.   NEOPLASM OF UNCERTAIN BEHAVIOR OF SKIN (4) Right Popliteal Epidermal / dermal shaving  Lesion diameter (cm):  0.3 Informed consent: discussed and consent obtained   Patient was prepped and draped in usual sterile fashion: Area prepped with alcohol. Anesthesia: the lesion was anesthetized in a standard fashion   Anesthetic:  1% lidocaine w/ epinephrine 1-100,000 buffered w/ 8.4% NaHCO3 Instrument used: flexible razor blade   Hemostasis achieved with: pressure, aluminum chloride and electrodesiccation   Outcome: patient tolerated procedure well   Post-procedure details: wound care instructions given   Post-procedure details comment:  Ointment and small bandage applied Specimen 1 - Surgical pathology Differential Diagnosis: Irritated Nevus vs other Check Margins: No Left Medial Lower Knee Epidermal / dermal shaving  Lesion diameter (cm):  0.2 Informed consent: discussed and consent obtained   Patient was prepped and draped in usual sterile fashion: Area prepped with alcohol. Anesthesia: the lesion was anesthetized in a standard fashion   Anesthetic:  1% lidocaine w/ epinephrine 1-100,000 buffered w/ 8.4% NaHCO3 Instrument used: flexible razor blade   Hemostasis achieved with: pressure, aluminum  chloride and electrodesiccation   Outcome: patient tolerated procedure well   Post-procedure details: wound care instructions given   Post-procedure details comment:  Ointment and small bandage applied Specimen 2 - Surgical pathology Differential Diagnosis: Irritated Hemangioma vs other Check Margins: No Right Posterior Shoulder Epidermal / dermal shaving  Lesion diameter (cm):  0.6 Informed consent: discussed and consent obtained   Patient was prepped and draped in usual sterile fashion: Area prepped with alcohol. Anesthesia: the lesion was anesthetized in a standard fashion   Anesthetic:  1% lidocaine w/ epinephrine 1-100,000 buffered w/ 8.4% NaHCO3 Instrument used: flexible razor blade   Hemostasis achieved with: pressure, aluminum chloride and electrodesiccation   Outcome: patient tolerated procedure well   Post-procedure details: wound care instructions given   Post-procedure details comment:  Ointment and small bandage applied Specimen 3 - Surgical pathology Differential Diagnosis: SK vs Nevus r/o Atypia Check Margins: Yes Left Sternal Notch Epidermal / dermal shaving  Lesion diameter (cm):  0.3 Informed consent: discussed and consent obtained   Patient was prepped and draped in usual sterile fashion: Area prepped with alcohol. Anesthesia: the lesion was anesthetized in a standard fashion   Anesthetic:  1% lidocaine w/ epinephrine 1-100,000 buffered w/ 8.4% NaHCO3 Instrument used: flexible razor blade   Hemostasis achieved with: pressure, aluminum chloride and electrodesiccation   Outcome: patient tolerated procedure well   Post-procedure details: wound care instructions given   Post-procedure details comment:  Ointment and small bandage applied Specimen 4 - Surgical pathology Differential Diagnosis: ISK vs Wart r/o Atypia Check  Margins: Yes Return in about 1 year (around 04/08/2024) for TBSE, Hx Dysplastic Nevus.  ICherlyn Labella, CMA, am acting as scribe for Willeen Niece, MD .   Documentation: I have reviewed the above documentation for accuracy and completeness, and I agree with the above.  Willeen Niece, MD

## 2023-04-09 NOTE — Patient Instructions (Addendum)
 Your prescription was sent to [facility] in [place]. A representative from [facility] will contact you within 3 business hours to verify your address and insurance information to schedule a free delivery. If for any reason you do not receive a phone call from them, please reach out to them. Their phone number is [phone] and their hours are Monday-Friday 9:00 am-5:00 pm.    Cryotherapy Aftercare  Wash gently with soap and water everyday.   Apply Vaseline and Band-Aid daily until healed.   Wound Care Instructions  Cleanse wound gently with soap and water once a day then pat dry with clean gauze. Apply a thin coat of Petrolatum (petroleum jelly, "Vaseline") over the wound (unless you have an allergy to this). We recommend that you use a new, sterile tube of Vaseline. Do not pick or remove scabs. Do not remove the yellow or white "healing tissue" from the base of the wound.  Cover the wound with fresh, clean, nonstick gauze and secure with paper tape. You may use Band-Aids in place of gauze and tape if the wound is small enough, but would recommend trimming much of the tape off as there is often too much. Sometimes Band-Aids can irritate the skin.  You should call the office for your biopsy report after 1 week if you have not already been contacted.  If you experience any problems, such as abnormal amounts of bleeding, swelling, significant bruising, significant pain, or evidence of infection, please call the office immediately.  FOR ADULT SURGERY PATIENTS: If you need something for pain relief you may take 1 extra strength Tylenol (acetaminophen) AND 2 Ibuprofen (200mg  each) together every 4 hours as needed for pain. (do not take these if you are allergic to them or if you have a reason you should not take them.) Typically, you may only need pain medication for 1 to 3 days.     Melanoma ABCDEs  Melanoma is the most dangerous type of skin cancer, and is the leading cause of  death from skin disease.  You are more likely to develop melanoma if you: Have light-colored skin, light-colored eyes, or red or blond hair Spend a lot of time in the sun Tan regularly, either outdoors or in a tanning bed Have had blistering sunburns, especially during childhood Have a close family member who has had a melanoma Have atypical moles or large birthmarks  Early detection of melanoma is key since treatment is typically straightforward and cure rates are extremely high if we catch it early.   The first sign of melanoma is often a change in a mole or a new dark spot.  The ABCDE system is a way of remembering the signs of melanoma.  A for asymmetry:  The two halves do not match. B for border:  The edges of the growth are irregular. C for color:  A mixture of colors are present instead of an even brown color. D for diameter:  Melanomas are usually (but not always) greater than 6mm - the size of a pencil eraser. E for evolution:  The spot keeps changing in size, shape, and color.  Please check your skin once per month between visits. You can use a small mirror in front and a large mirror behind you to keep an eye on the back side or your body.   If you see any new or changing lesions before your next follow-up, please call to schedule a visit.  Please continue daily skin protection including broad spectrum sunscreen  SPF 30+ to sun-exposed areas, reapplying every 2 hours as needed when you're outdoors.   Staying in the shade or wearing long sleeves, sun glasses (UVA+UVB protection) and wide brim hats (4-inch brim around the entire circumference of the hat) are also recommended for sun protection.     Due to recent changes in healthcare laws, you may see results of your pathology and/or laboratory studies on MyChart before the doctors have had a chance to review them. We understand that in some cases there may be results that are confusing or concerning to you. Please understand that  not all results are received at the same time and often the doctors may need to interpret multiple results in order to provide you with the best plan of care or course of treatment. Therefore, we ask that you please give [place] 2 business days to thoroughly review all your results before contacting the office for clarification. Should we see a critical lab result, you will be contacted sooner.   If You Need Anything After Your Visit  If you have any questions or concerns for your doctor, please call our main line at [phone] and press option 4 to reach your doctor's medical assistant. If no one answers, please leave a voicemail as directed and we will return your call as soon as possible. Messages left after 4 pm will be answered the following business day.   You may also send [place] a message via MyChart. We typically respond to MyChart messages within 1-2 business days.  For prescription refills, please ask your pharmacy to contact our office. Our fax number is [phone].  If you have an urgent issue when the clinic is closed that cannot wait until the next business day, you can page your doctor at the number below.    Please note that while we do our best to be available for urgent issues outside of office hours, we are not available 24/7.   If you have an urgent issue and are unable to reach [place], you may choose to seek medical care at your doctor's office, retail clinic, urgent care center, or emergency room.  If you have a medical emergency, please immediately call 911 or go to the emergency department.  Pager Numbers  - Dr. Gwen Pounds: [phone]  - Dr. Roseanne Reno: [phone]  - Dr. Katrinka Blazing: [phone]   In the event of inclement weather, please call our main line at [phone] for an update on the status of any delays or closures.  Dermatology Medication Tips: Please keep the boxes that topical medications come in in order to help keep track of the instructions about where and how  to use these. Pharmacies typically print the medication instructions only on the boxes and not directly on the medication tubes.   If your medication is too expensive, please contact our office at [phone] option 4 or send [place] a message through MyChart.   We are unable to tell what your co-pay for medications will be in advance as this is different depending on your insurance coverage. However, we may be able to find a substitute medication at lower cost or fill out paperwork to get insurance to cover a needed medication.   If a prior authorization is required to get your medication covered by your insurance company, please allow [place] 1-2 business days to complete this process.  Drug prices often vary depending on where the prescription is filled and some pharmacies may offer cheaper prices.  The website www.goodrx.com contains coupons for medications  through different pharmacies. The prices here do not account for what the cost may be with help from insurance (it may be cheaper with your insurance), but the website can give you the price if you did not use any insurance.  - You can print the associated coupon and take it with your prescription to the pharmacy.  - You may also stop by our office during regular business hours and pick up a GoodRx coupon card.  - If you need your prescription sent electronically to a different pharmacy, notify our office through [facility] or by phone at [phone] option 4.     Si Usted Necesita Algo Despus de Su Visita  Tambin puede enviarnos un mensaje a travs de Clinical cytogeneticist. Por lo general respondemos a los mensajes de MyChart en el transcurso de 1 a 2 das hbiles.  Para renovar recetas, por favor pida a su farmacia que se ponga en contacto con nuestra oficina. Annie Sable de fax es [place] [phone].  Si tiene un asunto urgente cuando la clnica est cerrada y que no puede esperar hasta el siguiente da hbil, puede llamar/localizar a su  doctor(a) al nmero que aparece a continuacin.   Por favor, tenga en cuenta que aunque hacemos todo lo posible para estar disponibles para asuntos urgentes fuera del horario de [place], no estamos disponibles las 24 horas del da, los 7 [address]  [address] de la [place].   Si tiene un problema urgente y no puede comunicarse con nosotros, puede optar por buscar atencin mdica  en el consultorio de su doctor(a), en una clnica privada, en un centro de atencin urgente o en una sala de emergencias.  Si tiene Engineer, drilling, por favor llame inmediatamente al 911 o vaya a la sala de emergencias.  Nmeros de bper  - Dr. Gwen Pounds: [phone]  - Dra. Roseanne Reno: [phone]  - Dr. Katrinka Blazing: [phone]   En caso de inclemencias del tiempo, por favor llame a Lacy Duverney principal al [phone] para una actualizacin sobre el [place] de cualquier retraso o cierre.  Consejos para la medicacin en dermatologa: Por favor, guarde las cajas en las que vienen los medicamentos de uso tpico para ayudarle a seguir las instrucciones sobre dnde y cmo usarlos. Las farmacias generalmente imprimen las instrucciones del medicamento slo en las cajas y no directamente en los tubos del [place].   Si su medicamento es muy caro, por favor, pngase en contacto con Rolm Gala llamando al [phone] y presione la opcin 4 o envenos un mensaje a travs de Clinical cytogeneticist.   No podemos decirle cul ser su copago por los medicamentos por adelantado ya que esto es diferente dependiendo de la cobertura de su seguro. Sin embargo, es posible que podamos encontrar un medicamento sustituto a Audiological scientist un formulario para que el seguro cubra el medicamento que se considera necesario.   Si se requiere una autorizacin previa para que su compaa de seguros [place] su medicamento, por favor permtanos de 1 a 2 das hbiles para completar [address]  Los precios de los medicamentos varan con frecuencia dependiendo del  Environmental consultant de dnde se surte la receta y alguna farmacias pueden ofrecer precios ms baratos.  El sitio web www.goodrx.com tiene cupones para medicamentos de Health and safety inspector. Los precios aqu no tienen en cuenta lo que podra costar con la ayuda del seguro (puede ser ms barato con su seguro), pero el sitio web puede darle el precio si no utiliz Tourist information centre manager.  - Puede imprimir el cupn correspondiente y  llevarlo con su receta a la farmacia.  - Tambin puede pasar por nuestra oficina durante el horario de atencin regular y Education officer, museum una tarjeta de cupones de GoodRx.  - Si necesita que su receta se enve electrnicamente a una farmacia diferente, informe a nuestra oficina a travs de MyChart de [place] o por telfono llamando al [phone] y presione la opcin 4.

## 2023-04-10 ENCOUNTER — Encounter: Payer: Self-pay | Admitting: Radiology

## 2023-04-10 ENCOUNTER — Ambulatory Visit
Admission: RE | Admit: 2023-04-10 | Discharge: 2023-04-10 | Disposition: A | Discharge status: Home / Self Care | Payer: Managed Care, Other (non HMO) | Source: Ambulatory Visit | Attending: Family Medicine | Admitting: Family Medicine

## 2023-04-10 DIAGNOSIS — Z1231 Encounter for screening mammogram for malignant neoplasm of breast: Secondary | ICD-10-CM | POA: Diagnosis present

## 2023-04-11 LAB — SURGICAL PATHOLOGY

## 2023-04-15 ENCOUNTER — Telehealth: Payer: Self-pay

## 2023-04-15 NOTE — Telephone Encounter (Signed)
-----   Message from Willeen Niece sent at 04/15/2023 10:24 AM EDT ----- 1. Skin, right popliteal :       INTRADERMAL MELANOCYTIC NEVUS  2. Skin, left medial lower knee :       HEMANGIOMA  3. Skin, right posterior shoulder :       DYSPLASTIC COMPOUND NEVUS WITH MILD ATYPIA, LIMITED MARGINS FREE  4. Skin, left sternal notch :       SEBORRHEIC KERATOSIS, INFLAMED    1. Benign mole 2. Benign cherry angioma 3. Mildly atypical mole 4. Benign ISK - please call patient

## 2023-04-15 NOTE — Telephone Encounter (Signed)
Advised patient of biopsy results. No further treatment needed.

## 2023-04-16 ENCOUNTER — Other Ambulatory Visit: Payer: Self-pay | Admitting: Family Medicine

## 2023-04-16 DIAGNOSIS — R928 Other abnormal and inconclusive findings on diagnostic imaging of breast: Secondary | ICD-10-CM

## 2023-04-18 ENCOUNTER — Ambulatory Visit
Admission: RE | Admit: 2023-04-18 | Discharge: 2023-04-18 | Disposition: A | Discharge status: Home / Self Care | Source: Ambulatory Visit | Attending: Family Medicine | Admitting: Family Medicine

## 2023-04-18 DIAGNOSIS — R928 Other abnormal and inconclusive findings on diagnostic imaging of breast: Secondary | ICD-10-CM | POA: Diagnosis present

## 2024-01-27 ENCOUNTER — Encounter: Payer: Self-pay | Admitting: Family Medicine

## 2024-01-27 ENCOUNTER — Ambulatory Visit: Admitting: Family Medicine

## 2024-01-27 VITALS — BP 109/69 | HR 81 | Ht 64.0 in | Wt 189.3 lb

## 2024-01-27 DIAGNOSIS — F410 Panic disorder [episodic paroxysmal anxiety] without agoraphobia: Secondary | ICD-10-CM | POA: Diagnosis not present

## 2024-01-27 DIAGNOSIS — F419 Anxiety disorder, unspecified: Secondary | ICD-10-CM

## 2024-01-27 NOTE — Progress Notes (Signed)
 "     Established patient visit   Patient: Ariel Lawson   DOB: [birth year]   58 y.o. Female  MRN: [id number] Visit Date: 01/27/2024  Today's healthcare provider: Nancyann Perry, MD   Chief Complaint  Patient presents with   Documentation    Patient is present today in regards to [facility]. Reports having completed once a year. No changes needed    Subjective    Discussed the use of AI scribe software for clinical note transcription with the patient, who gave verbal consent to proceed.  History of Present Illness   Ariel Lawson is a 58 year old female who presents for FMLA renewal due to anxiety and sleep disturbances.  She has been utilizing FMLA for the past few years, taking approximately six to seven weeks last year and ten weeks the year before. This year, she used a couple of weeks at the beginning of the year but none since July after her supervisor retired. She is concerned as her HR informed her that her FMLA has run out, causing her to feel 'in a panic mode'.  She has been experiencing several bouts of insomnia over the past couple of weeks, which she attributes to changes at work, including new personnel from [employer] down to her supervisor. She has been seeing a therapist twice a month for nearly two years, which she finds has helped tremendously.  She experiences overwhelming anxiety, which has led to her taking days off work. Her therapist has provided documentation for these absences, allowing her to take up to five days off per episode. She uses alprazolam  only when absolutely necessary, stating 'if it gets real bad', but prefers not to take medication unless needed.      Medications: Show/hide medication list[1]  Review of Systems  Constitutional:  Negative for appetite change, chills, fatigue and fever.  Respiratory:  Negative for chest tightness and shortness of breath.   Cardiovascular:  Negative for chest pain and palpitations.  Gastrointestinal:   Negative for abdominal pain, nausea and vomiting.  Neurological:  Negative for dizziness and weakness.       Objective    BP 109/69 (BP Location: Left Arm, Patient Position: Sitting, Cuff Size: Large)   Pulse 81   Ht 5' 4 (1.626 m)   Wt 189 lb 4.8 oz (85.9 kg)   LMP 01/29/2021   SpO2 100%   BMI 32.49 kg/m   Physical Exam   General appearance: Mildly obese female, cooperative and in no acute distress Head: Normocephalic, without obvious abnormality, atraumatic Respiratory: Respirations even and unlabored, normal respiratory rate Extremities: All extremities are intact.  Skin: Skin color, texture, turgor normal. No rashes seen  Psych: Appropriate mood and affect. Neurologic: Mental status: Alert, oriented to person, place, and time, thought content appropriate.    Assessment & Plan        Generalized anxiety disorder and panic attacks. Chronic anxiety managed with therapy and alprazolam . FMLA utilized for work-related stress. - Continue therapy twice a month. - Use alprazolam  as needed for severe episodes. - Renewed FMLA paperwork for up to five days off per episode, up to three times a week.         Nancyann Perry, MD  [facility] [phone] (phone) [phone] (fax)  [facility]     [1]  Outpatient Medications Prior to Visit  Medication Sig   ALPRAZolam  (XANAX ) 0.25 MG tablet TAKE 0.5-1 TABLETS (0.125-0.25 MG TOTAL) BY MOUTH 2 (TWO) TIMES DAILY  AS NEEDED FOR ANXIETY.   Cholecalciferol (VITAMIN D ) 50 MCG (2000 UT) CAPS Take 1 capsule by mouth daily.   Ferrous Sulfate (IRON  PO) Take 30 mg by mouth. Fernando Iron  30 mg superior absorbent 2 per day   Ruxolitinib Phosphate  (OPZELURA ) 1.5 % CREA Apply to affected areas rash twice daily until improved.   No facility-administered medications prior to visit.   "

## 2024-05-04 ENCOUNTER — Encounter: Admitting: Dermatology
